# Patient Record
Sex: Male | Born: 1990 | Race: White | Hispanic: No | Marital: Single | Smoking: Never smoker
Health system: Southern US, Community
[De-identification: ages and names within clinical notes are randomized; demographics above are authoritative.]

## PROBLEM LIST (undated history)

## (undated) DIAGNOSIS — L709 Acne, unspecified: Secondary | ICD-10-CM

## (undated) DIAGNOSIS — F988 Other specified behavioral and emotional disorders with onset usually occurring in childhood and adolescence: Secondary | ICD-10-CM

## (undated) DIAGNOSIS — T7840XA Allergy, unspecified, initial encounter: Secondary | ICD-10-CM

## (undated) HISTORY — DX: Other specified behavioral and emotional disorders with onset usually occurring in childhood and adolescence: F98.8

## (undated) HISTORY — DX: Allergy, unspecified, initial encounter: T78.40XA

## (undated) HISTORY — DX: Acne, unspecified: L70.9

## (undated) HISTORY — PX: ANTERIOR INTEROSSEOUS NERVE DECOMPRESSION: SHX5735

---
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
## 2009-09-09 ENCOUNTER — Ambulatory Visit: Payer: Self-pay | Admitting: Family Medicine

## 2011-01-16 ENCOUNTER — Ambulatory Visit (INDEPENDENT_AMBULATORY_CARE_PROVIDER_SITE_OTHER): Payer: No Typology Code available for payment source | Admitting: Family Medicine

## 2011-01-16 DIAGNOSIS — J209 Acute bronchitis, unspecified: Secondary | ICD-10-CM

## 2011-01-16 DIAGNOSIS — H109 Unspecified conjunctivitis: Secondary | ICD-10-CM

## 2011-01-16 DIAGNOSIS — H6690 Otitis media, unspecified, unspecified ear: Secondary | ICD-10-CM

## 2011-09-14 ENCOUNTER — Encounter: Payer: Self-pay | Admitting: Family Medicine

## 2011-09-15 ENCOUNTER — Encounter: Payer: Self-pay | Admitting: Family Medicine

## 2011-09-15 ENCOUNTER — Ambulatory Visit (INDEPENDENT_AMBULATORY_CARE_PROVIDER_SITE_OTHER): Payer: No Typology Code available for payment source | Admitting: Family Medicine

## 2011-09-15 VITALS — BP 120/80 | HR 82 | Wt 252.0 lb

## 2011-09-15 DIAGNOSIS — F419 Anxiety disorder, unspecified: Secondary | ICD-10-CM

## 2011-09-15 DIAGNOSIS — G4700 Insomnia, unspecified: Secondary | ICD-10-CM

## 2011-09-15 DIAGNOSIS — F411 Generalized anxiety disorder: Secondary | ICD-10-CM

## 2011-09-15 NOTE — Patient Instructions (Signed)
Discussed the best way to handle this situation in regards to treating this gentleman with indifference and also make sure that he is never alone especially when coming and going from private places. Try to document everything as much is possible if there is any confrontation.
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
## 2014-07-03 ENCOUNTER — Ambulatory Visit (INDEPENDENT_AMBULATORY_CARE_PROVIDER_SITE_OTHER): Payer: BC Managed Care – PPO | Admitting: Medical

## 2014-07-03 ENCOUNTER — Encounter: Payer: Self-pay | Admitting: Medical

## 2014-07-03 VITALS — BP 112/80 | HR 92 | Temp 98.3°F | Resp 16 | Wt 261.0 lb

## 2014-07-03 DIAGNOSIS — F341 Dysthymic disorder: Secondary | ICD-10-CM

## 2014-07-03 DIAGNOSIS — F411 Generalized anxiety disorder: Secondary | ICD-10-CM

## 2014-07-03 MED ORDER — CITALOPRAM HYDROBROMIDE 20 MG PO TABS: ORAL_TABLET | ORAL | Status: DC

## 2014-07-03 NOTE — Progress Notes (Signed)
  Subjective:     Keith Olsen is a 23 y.o. male who presents for c/o "depression."   He reports a depressed mood for 3-4 years now. Past when he discussed this with his parents they recommended against medication but lately they feel like he needs to do something else to help his depressed mood. He feeling down often, worries a lot, has nightmares at times, doesn't sleep well, not really happy most of the time, does have some mood swings. Denies suicidal or homicidal ideation.  Lives with mother, but gets along with both parents and his sister. In general hobbies include outdoors things, fishing.   Works at [employer] full time, works varying shifts which is a problem for him.  He feels lonely and down most of the time.  Not exercising, diet could be better.  Has been on Zoloft in the remote past.  He does note a major incident in his life in college that has continued to be a problem for him emotionally.  Went to counseling last year for several months.   No other aggravating or relieving factors. No other c/o.  The following portions of the patient's history were reviewed and updated as appropriate: allergies, current medications, past family history, past medical history, past social history, past surgical history and problem list.  Review of Systems General: No recent weight change, no anorexia Neuro: +sleep changes Psychology: Denies suicidal ideation, hallucinations, alcohol use, drug use  Objective:    BP 112/80  Pulse 92  Temp(Src) 98.3 F (36.8 C) (Oral)  Resp 16  Wt 261 lb (118.389 kg)   General appearance: alert, no distress, WD/WN, male Psychiatric: normal affect, behavior normal, pleasant, normal hygiene and grooming.        Assessment:   Encounter Diagnosis  Name Primary?  . Dysthymic disorder Yes     Plan:   Discussed symptoms and concerns.  PHQ-9 with score of 14.  Discussed his symptoms more consistent with Dysthymia and anxiety.  Discussed several  medication options, risk of medications /benefits of medications.   Begin Medications: Citalopram.  Consider counseling.   Discussed eating healthy diet, exercising regularly, getting 6-8 hours of sleep, getting a more consistent schedule, discussed nurturing friendships, having a group of supportive friends.  Recheck in 3-4.  Return or call sooner if worse or no improvement.  Greater than 50% of time/>30 min spent face-to-face discussing concerns, evaluation, treatment plan, and followup.

## 2014-10-08 ENCOUNTER — Ambulatory Visit (INDEPENDENT_AMBULATORY_CARE_PROVIDER_SITE_OTHER): Payer: BC Managed Care – PPO | Admitting: Family Medicine

## 2014-10-08 ENCOUNTER — Encounter: Payer: Self-pay | Admitting: Family Medicine

## 2014-10-08 VITALS — BP 130/90 | HR 79 | Wt 262.0 lb

## 2014-10-08 DIAGNOSIS — M79671 Pain in right foot: Secondary | ICD-10-CM

## 2014-10-08 NOTE — Patient Instructions (Signed)
800 mg of ibuprofen 3 times per day and arch supports. Do this for the next 4 or 5 days and let me know.

## 2014-10-08 NOTE — Progress Notes (Signed)
   Subjective:    Patient ID: Keith Olsen, male    DOB: August 20, 1991, 23 y.o.   MRN: [id number]  HPI 3 days ago he woke up with right foot pain. No history of previous recent injury or he feels a tight sensation in his ankle worse with walking. No new physical activities or shoes within the last couple of weeks.   Review of Systems     Objective:   Physical Exam Exam of the right foot shows full motion of the foot and ankle passively however active range of motion was slightly uncomfortable. Some tenderness spell patient over the tarsometatarsal area especially the midportion. No laxity noted. Normal strength.       Assessment & Plan:  Right foot pain  I will treat with ibuprofen and recommend arch supports. Discussed follow-up in regard to x-rays or possible further intervention at a later date.

## 2014-12-27 ENCOUNTER — Encounter: Payer: Self-pay | Admitting: Medical

## 2014-12-27 ENCOUNTER — Ambulatory Visit (INDEPENDENT_AMBULATORY_CARE_PROVIDER_SITE_OTHER): Payer: BLUE CROSS/BLUE SHIELD | Admitting: Medical

## 2014-12-27 VITALS — BP 140/90 | HR 72 | Temp 98.0°F | Resp 16 | Wt 270.0 lb

## 2014-12-27 DIAGNOSIS — R030 Elevated blood-pressure reading, without diagnosis of hypertension: Secondary | ICD-10-CM

## 2014-12-27 DIAGNOSIS — R82998 Other abnormal findings in urine: Secondary | ICD-10-CM

## 2014-12-27 DIAGNOSIS — R8299 Other abnormal findings in urine: Secondary | ICD-10-CM

## 2014-12-27 DIAGNOSIS — R3129 Other microscopic hematuria: Secondary | ICD-10-CM

## 2014-12-27 DIAGNOSIS — M791 Myalgia, unspecified site: Secondary | ICD-10-CM

## 2014-12-27 DIAGNOSIS — R312 Other microscopic hematuria: Secondary | ICD-10-CM

## 2014-12-27 DIAGNOSIS — R5383 Other fatigue: Secondary | ICD-10-CM

## 2014-12-27 LAB — CBC WITH DIFFERENTIAL/PLATELET
Basophils Absolute: 0 10*3/uL (ref 0.0–0.1)
Basophils Relative: 0 % (ref 0–1)
Eosinophils Absolute: 0.5 10*3/uL (ref 0.0–0.7)
Eosinophils Relative: 5 % (ref 0–5)
HCT: 48.3 % (ref 39.0–52.0)
Hemoglobin: 17.1 g/dL — ABNORMAL HIGH (ref 13.0–17.0)
Lymphocytes Relative: 30 % (ref 12–46)
Lymphs Abs: 2.7 10*3/uL (ref 0.7–4.0)
MCH: 32.4 pg (ref 26.0–34.0)
MCHC: 35.4 g/dL (ref 30.0–36.0)
MCV: 91.7 fL (ref 78.0–100.0)
MPV: 10 fL (ref 8.6–12.4)
Monocytes Absolute: 0.7 10*3/uL (ref 0.1–1.0)
Monocytes Relative: 8 % (ref 3–12)
Neutro Abs: 5.1 10*3/uL (ref 1.7–7.7)
Neutrophils Relative %: 57 % (ref 43–77)
Platelets: 309 10*3/uL (ref 150–400)
RBC: 5.27 MIL/uL (ref 4.22–5.81)
RDW: 13.3 % (ref 11.5–15.5)
WBC: 9 10*3/uL (ref 4.0–10.5)

## 2014-12-27 LAB — POCT URINALYSIS DIPSTICK
Bilirubin, UA: NEGATIVE
Glucose, UA: NEGATIVE
Ketones, UA: NEGATIVE
Leukocytes, UA: NEGATIVE
Nitrite, UA: NEGATIVE
Spec Grav, UA: 1.02
Urobilinogen, UA: NEGATIVE
pH, UA: 6

## 2014-12-27 LAB — HEPATIC FUNCTION PANEL
ALT: 555 U/L — ABNORMAL HIGH (ref 0–53)
AST: 1378 U/L — ABNORMAL HIGH (ref 0–37)
Albumin: 4.6 g/dL (ref 3.5–5.2)
Alkaline Phosphatase: 64 U/L (ref 39–117)
Bilirubin, Direct: 0.2 mg/dL (ref 0.0–0.3)
Indirect Bilirubin: 0.7 mg/dL (ref 0.2–1.2)
Total Bilirubin: 0.9 mg/dL (ref 0.2–1.2)
Total Protein: 7.4 g/dL (ref 6.0–8.3)

## 2014-12-27 LAB — BASIC METABOLIC PANEL
BUN: 10 mg/dL (ref 6–23)
CO2: 27 mEq/L (ref 19–32)
Calcium: 9.8 mg/dL (ref 8.4–10.5)
Chloride: 102 mEq/L (ref 96–112)
Creat: 0.73 mg/dL (ref 0.50–1.35)
Glucose, Bld: 83 mg/dL (ref 70–99)
Potassium: 5.1 mEq/L (ref 3.5–5.3)
Sodium: 138 mEq/L (ref 135–145)

## 2014-12-27 LAB — CK: Total CK: >50000 U/L (ref 7–232)

## 2014-12-27 NOTE — Addendum Note (Signed)
Addended by: Lilli LightLOMAX, Keyston Ardolino G on: 12/27/2014 03:10 PM   Modules accepted: Orders

## 2014-12-27 NOTE — Progress Notes (Signed)
Subjective: Here for urinary concerns.  Started working out recently.  Working with an Event organiserathletic trainer.  Just start doing some routine exercise including weight lifting 2 days ago.  Is very sore today particular upper arm muscles.  Yesterday urine was brown, this morning had some discomfort with urination.  This scared him.  Sore all over. Hasn't seen urine like this prior.   Water intake in general has been good of late.   Denies fever, no blood in urine, urinating less than usual.  Some recent urinary urgency.  No prior UTI.   No concern for STD.   Last sexual activity 2 years ago.  No stool problems, last BM today before he came here.  History of urinary tract infection, no history of rhabdomyolysis. No history of hospitalization. No history of dehydration. Denies any drug use. No other aggravating or relieving factors. No other complaint.  Past Medical History  Diagnosis Date  . Allergy     RHINITIS  . ADD (attention deficit disorder)   . Acne    Review of Systems Constitutional: -fever, -chills, -sweats, -unexpected weight change,-fatigue ENT: -runny nose, -ear pain, -sore throat Cardiology:  -chest pain, -palpitations, -edema Respiratory: -cough, -shortness of breath, -wheezing Gastroenterology: -abdominal pain, -nausea, -vomiting, -diarrhea, -constipation  Hematology: -bleeding or bruising problems Musculoskeletal: -arthralgias, +myalgias, -joint swelling, -back pain Ophthalmology: -vision changes Neurology: +feels somewhat lethargic, -headache, -weakness, -tingling, -numbness    Objective: BP 140/90 mmHg  Pulse 72  Temp(Src) 98 F (36.7 C) (Oral)  Resp 16  Wt 270 lb (122.471 kg)  SpO2 97%  General appearance: alert, no distress, WD/WN, white male Skin is somewhat clammy and cool No jaundice or icterus Oral cavity: MMM, no lesions Neck: supple, no lymphadenopathy, no thyromegaly, no masses Heart: RRR, normal S1, S2, no murmurs Lungs: CTA bilaterally, no wheezes, rhonchi,  or rales Abdomen: +bs, soft, non tender, non distended, no masses, no hepatomegaly, no splenomegaly Back: nontender Tender over upper arms bilat Pulses: 2+ symmetric, upper and lower extremities, normal cap refill Ext: no edema   Assessment: Encounter Diagnoses  Name Primary?  Dot Been. Dark urine Yes  . Other fatigue   . Myalgia   . Microscopic hematuria   . Elevated blood pressure reading without diagnosis of hypertension      Plan: Discussed symptoms, possible causes, urine findings.  given his recent weight lifting and strenuous activity, the urine findings likely due to activity and insufficient hydration of water.  However, we did discuss other possibilities including rhabdo, renal failure, other, etc.   STAT labs today, advised no strenuous activity, rest, significantly push water intake, no pain medication until we call back with labs.   Urine micro with no worrisome findings.   F/u pending labs.

## 2014-12-28 ENCOUNTER — Encounter (HOSPITAL_COMMUNITY): Payer: Self-pay

## 2014-12-28 ENCOUNTER — Ambulatory Visit (INDEPENDENT_AMBULATORY_CARE_PROVIDER_SITE_OTHER): Payer: BLUE CROSS/BLUE SHIELD | Admitting: Medical

## 2014-12-28 ENCOUNTER — Inpatient Hospital Stay (HOSPITAL_COMMUNITY)
Admission: EM | Admit: 2014-12-28 | Discharge: 2014-12-31 | DRG: 558 | Disposition: A | Discharge status: Home / Self Care | Payer: BLUE CROSS/BLUE SHIELD | Attending: Internal Medicine | Admitting: Internal Medicine

## 2014-12-28 DIAGNOSIS — R319 Hematuria, unspecified: Secondary | ICD-10-CM | POA: Diagnosis present

## 2014-12-28 DIAGNOSIS — R7989 Other specified abnormal findings of blood chemistry: Secondary | ICD-10-CM | POA: Diagnosis present

## 2014-12-28 DIAGNOSIS — M6282 Rhabdomyolysis: Secondary | ICD-10-CM | POA: Diagnosis not present

## 2014-12-28 DIAGNOSIS — R740 Nonspecific elevation of levels of transaminase and lactic acid dehydrogenase [LDH]: Secondary | ICD-10-CM

## 2014-12-28 DIAGNOSIS — R945 Abnormal results of liver function studies: Secondary | ICD-10-CM

## 2014-12-28 DIAGNOSIS — Z8249 Family history of ischemic heart disease and other diseases of the circulatory system: Secondary | ICD-10-CM

## 2014-12-28 DIAGNOSIS — Z809 Family history of malignant neoplasm, unspecified: Secondary | ICD-10-CM

## 2014-12-28 DIAGNOSIS — R748 Abnormal levels of other serum enzymes: Secondary | ICD-10-CM

## 2014-12-28 DIAGNOSIS — R7401 Elevation of levels of liver transaminase levels: Secondary | ICD-10-CM

## 2014-12-28 DIAGNOSIS — M791 Myalgia, unspecified site: Secondary | ICD-10-CM

## 2014-12-28 DIAGNOSIS — L709 Acne, unspecified: Secondary | ICD-10-CM | POA: Diagnosis present

## 2014-12-28 DIAGNOSIS — K760 Fatty (change of) liver, not elsewhere classified: Secondary | ICD-10-CM | POA: Diagnosis present

## 2014-12-28 LAB — CBC
HCT: 49 % (ref 39.0–52.0)
Hemoglobin: 17 g/dL (ref 13.0–17.0)
MCH: 32.3 pg (ref 26.0–34.0)
MCHC: 34.7 g/dL (ref 30.0–36.0)
MCV: 93 fL (ref 78.0–100.0)
MPV: 9.8 fL (ref 8.6–12.4)
Platelets: 292 10*3/uL (ref 150–400)
RBC: 5.27 MIL/uL (ref 4.22–5.81)
RDW: 13.1 % (ref 11.5–15.5)
WBC: 7.7 10*3/uL (ref 4.0–10.5)

## 2014-12-28 LAB — HEPATIC FUNCTION PANEL
ALT: 650 U/L — ABNORMAL HIGH (ref 0–53)
AST: 1360 U/L — ABNORMAL HIGH (ref 0–37)
Albumin: 4.4 g/dL (ref 3.5–5.2)
Alkaline Phosphatase: 70 U/L (ref 39–117)
Bilirubin, Direct: 0.2 mg/dL (ref 0.0–0.3)
Indirect Bilirubin: 0.6 mg/dL (ref 0.2–1.2)
Total Bilirubin: 0.8 mg/dL (ref 0.2–1.2)
Total Protein: 7.2 g/dL (ref 6.0–8.3)

## 2014-12-28 LAB — CBC WITH DIFFERENTIAL/PLATELET
Basophils Absolute: 0 10*3/uL (ref 0.0–0.1)
Basophils Relative: 0 % (ref 0–1)
Eosinophils Absolute: 0.5 10*3/uL (ref 0.0–0.7)
Eosinophils Relative: 4 % (ref 0–5)
HEMATOCRIT: 47.7 % (ref 39.0–52.0)
HEMOGLOBIN: 17.4 g/dL — AB (ref 13.0–17.0)
Lymphocytes Relative: 30 % (ref 12–46)
Lymphs Abs: 3.6 10*3/uL (ref 0.7–4.0)
MCH: 32.8 pg (ref 26.0–34.0)
MCHC: 36.5 g/dL — AB (ref 30.0–36.0)
MCV: 90 fL (ref 78.0–100.0)
MONOS PCT: 6 % (ref 3–12)
Monocytes Absolute: 0.7 10*3/uL (ref 0.1–1.0)
NEUTROS ABS: 7.3 10*3/uL (ref 1.7–7.7)
Neutrophils Relative %: 60 % (ref 43–77)
Platelets: 309 10*3/uL (ref 150–400)
RBC: 5.3 MIL/uL (ref 4.22–5.81)
RDW: 12.4 % (ref 11.5–15.5)
WBC: 12.1 10*3/uL — AB (ref 4.0–10.5)

## 2014-12-28 LAB — POCT URINALYSIS DIPSTICK
Bilirubin, UA: NEGATIVE
Glucose, UA: NEGATIVE
Ketones, UA: NEGATIVE
LEUKOCYTES UA: NEGATIVE
NITRITE UA: NEGATIVE
PROTEIN UA: 0.15
Spec Grav, UA: 1.025
UROBILINOGEN UA: NEGATIVE
pH, UA: 6

## 2014-12-28 LAB — BASIC METABOLIC PANEL
BUN: 7 mg/dL (ref 6–23)
CO2: 29 mEq/L (ref 19–32)
Calcium: 9.7 mg/dL (ref 8.4–10.5)
Chloride: 98 mEq/L (ref 96–112)
Creat: 0.74 mg/dL (ref 0.50–1.35)
Glucose, Bld: 93 mg/dL (ref 70–99)
Potassium: 4.9 mEq/L (ref 3.5–5.3)
Sodium: 136 mEq/L (ref 135–145)

## 2014-12-28 LAB — IRON AND TIBC
%SAT: 27 % (ref 20–55)
IRON: 74 ug/dL (ref 42–165)
TIBC: 270 ug/dL (ref 215–435)
UIBC: 196 ug/dL (ref 125–400)

## 2014-12-28 LAB — HEPATITIS PANEL, ACUTE
HCV Ab: NEGATIVE
HEP A IGM: NONREACTIVE
HEP B S AG: NEGATIVE
Hep B C IgM: NONREACTIVE

## 2014-12-28 LAB — CK: Total CK: >50000 U/L (ref 7–232)

## 2014-12-28 LAB — LACTATE DEHYDROGENASE: LDH: 1990 U/L — ABNORMAL HIGH (ref 94–250)

## 2014-12-28 MED ORDER — SODIUM CHLORIDE 0.9 % IV BOLUS (SEPSIS)
2000.0000 mL | Freq: Once | INTRAVENOUS | Status: AC
Start: 2014-12-28 — End: 2014-12-29
  Administered 2014-12-29: 2000 mL via INTRAVENOUS

## 2014-12-28 MED ORDER — SODIUM CHLORIDE 0.9 % IV BOLUS (SEPSIS)
1000.0000 mL | INTRAVENOUS | Status: AC
Start: 2014-12-28 — End: 2014-12-29
  Administered 2014-12-28: 1000 mL via INTRAVENOUS

## 2014-12-28 NOTE — ED Notes (Signed)
Per pt, has had increase in working out and then drinking alcohol afterwards.  Pt noticed brown urine on Wednesday. Primary did labs.  Pt elevated liver enzymes.  Told to come to ED

## 2014-12-28 NOTE — H&P (Signed)
PCP: Carollee Herter, MD    Chief Complaint:    HPI: Keith Olsen is a 24 y.o. male   has a past medical history of Allergy; ADD (attention deficit disorder); and Acne.   Presented with  5 days ago patient went to gym and lifted weights, he was together with a friend who was encouraging him. Patient develop severe pain in his upper arms and some swelling. He had couple of beers there after. 2 days ago patient reported brown urine. Patient presented to his PCP and got some blood work done. He was intsructed to drink plenty of water. Currently patient states that his urine is clear. From outside labs it was noted that his CK was 50K and severe LFT elevation. Patient took 4 tabs of ibuprofen twice. Did not take any acetaminophen. Patient endorses improvement currently with decreased pain.   Hospitalist was called for admission for significant rhabdomyalisis  Review of Systems:    Pertinent positives include: arm pain and swelling, dark urine  Constitutional:  No weight loss, night sweats, Fevers, chills, fatigue, weight loss  HEENT:  No headaches, Difficulty swallowing,Tooth/dental problems,Sore throat,  No sneezing, itching, ear ache, nasal congestion, post nasal drip,  Cardio-vascular:  No chest pain, Orthopnea, PND, anasarca, dizziness, palpitations.no Bilateral lower extremity swelling  GI:  No heartburn, indigestion, abdominal pain, nausea, vomiting, diarrhea, change in bowel habits, loss of appetite, melena, blood in stool, hematemesis Resp:  no shortness of breath at rest. No dyspnea on exertion, No excess mucus, no productive cough, No non-productive cough, No coughing up of blood.No change in color of mucus.No wheezing. Skin:  no rash or lesions. No jaundice GU:  no dysuria, change in color of urine, no urgency or frequency. No straining to urinate.  No flank pain.  Musculoskeletal:  No joint pain or no joint swelling. No decreased range of motion. No back pain.    Psych:  No change in mood or affect. No depression or anxiety. No memory loss.  Neuro: no localizing neurological complaints, no tingling, no weakness, no double vision, no gait abnormality, no slurred speech, no confusion  Otherwise ROS are negative except for above, 10 systems were reviewed  Past Medical History: Past Medical History  Diagnosis Date  . Allergy     RHINITIS  . ADD (attention deficit disorder)   . Acne    History reviewed. No pertinent past surgical history.   Medications: Prior to Admission medications   Not on File    Allergies:  No Known Allergies  Social History:  Ambulatory   independently  Lives at home  With family     reports that he has never smoked. He has never used smokeless tobacco. He reports that he drinks alcohol. He reports that he does not use illicit drugs.    Family History: family history includes Cancer in his maternal grandmother; Glaucoma in his father and paternal grandmother; Heart disease in his maternal grandfather; Hypertension in his maternal grandfather.    Physical Exam: Patient Vitals for the past 24 hrs:  BP Temp Temp src Pulse Resp SpO2  12/28/14 2235 134/82 mmHg - - 101 18 96 %  12/28/14 1805 161/87 mmHg 97.6 F (36.4 C) Oral 108 20 100 %    1. General:  in No Acute distress 2. Psychological: Alert and  Oriented 3. Head/ENT:   Moist   Mucous Membranes  Head Non traumatic, neck supple                          Normal   Dentition 4. SKIN: normal  Skin turgor,  Skin clean Dry and intact no rash 5. Heart: Regular rate and rhythm no Murmur, Rub or gallop 6. Lungs: Clear to auscultation bilaterally, no wheezes or crackles   7. Abdomen: Soft, non-tender, Non distended 8. Lower extremities: no clubbing, cyanosis, or edema 9. Neurologically Grossly intact, moving all 4 extremities equally 10. MSK: Normal range of motion, some swelling of biceps bilaterally noted,  no symptoms consistent with  compartment syndrome  body mass index is unknown because there is no height or weight on file.   Labs on Admission:   Results for orders placed or performed during the hospital encounter of 12/28/14 (from the past 24 hour(s))  CBC with Differential     Status: Abnormal   Collection Time: 12/28/14 10:59 PM  Result Value Ref Range   WBC 12.1 (H) 4.0 - 10.5 K/uL   RBC 5.30 4.22 - 5.81 MIL/uL   Hemoglobin 17.4 (H) 13.0 - 17.0 g/dL   HCT 29.547.7 62.139.0 - 30.852.0 %   MCV 90.0 78.0 - 100.0 fL   MCH 32.8 26.0 - 34.0 pg   MCHC 36.5 (H) 30.0 - 36.0 g/dL   RDW 65.712.4 84.611.5 - 96.215.5 %   Platelets 309 150 - 400 K/uL   Neutrophils Relative % 60 43 - 77 %   Neutro Abs 7.3 1.7 - 7.7 K/uL   Lymphocytes Relative 30 12 - 46 %   Lymphs Abs 3.6 0.7 - 4.0 K/uL   Monocytes Relative 6 3 - 12 %   Monocytes Absolute 0.7 0.1 - 1.0 K/uL   Eosinophils Relative 4 0 - 5 %   Eosinophils Absolute 0.5 0.0 - 0.7 K/uL   Basophils Relative 0 0 - 1 %   Basophils Absolute 0.0 0.0 - 0.1 K/uL    UA small hemoglobin  No results found for: HGBA1C  CrCl cannot be calculated (Unknown ideal weight.).  BNP (last 3 results) No results for input(s): PROBNP in the last 8760 hours.  Other results:  I have pearsonaly reviewed this: ECG not obtained  There were no vitals filed for this visit.   Cultures: No results found for: SDES, SPECREQUEST, CULT, REPTSTATUS   Radiological Exams on Admission: No results found.  Chart has been reviewed  Assessment/Plan  24yo Male here with his nontraumatic rhabdomyolysis after excessive exercise with normal creatinine and evidence of elevated LFTs likely secondary to muscular breakdown  Present on Admission:  . Rhabdomyolysis - give IV fluids continue to morning to serial CK, patient is already 5 days out with self reported improvement in the urine collar as well as tenderness to his upper extremities  . Elevated LFTs - most likely secondary to muscular breakdown we'll continue to  follow. Report acute hepatitis panel is negative   Prophylaxis: SCD   CODE STATUS:  FULL CODE   Other plan as per orders.  I have spent a total of 55 min on this admission  Keith Olsen 12/28/2014, 11:58 PM  [facility]  Pager [phone]   after 2 AM please page floor coverage PA If 7AM-7PM, please contact the day team taking care of the patient  [email or web address]  Password [email or web address]

## 2014-12-28 NOTE — Addendum Note (Signed)
Addended by: Herminio CommonsJOHNSON, Freja Faro A on: 12/28/2014 04:21 PM   Modules accepted: Orders

## 2014-12-28 NOTE — ED Provider Notes (Signed)
CSN: [id number]     Arrival date & time 12/28/14  1801 History   First MD Initiated Contact with Patient 12/28/14 2233     Chief Complaint  Patient presents with  . Hematuria     (Consider location/radiation/quality/duration/timing/severity/associated sxs/prior Treatment) HPI   This is a 24 year old male presents emergency department for chief complaint of dark colored urine. The patient was seen at his primary care office 2 days ago. 5 days ago the patient began working out. He does not normally workout. He stated he lifted heavy. He had severe pain in his arms and noticed that his urine turned dark brown. His physician told him that his liver enzymes were elevated and that he needed to go to the emergency department. She did not come yesterday because of two-hour weights. His physician told him to drink plenty of fluids and return in the morning. He states his urine is clear.  Denies fevers, chills, myalgias, arthralgias. Denies DOE, SOB, chest tightness or pressure, radiation to left arm, jaw or back, or diaphoresis. Denies dysuria, flank pain, suprapubic pain, frequency, urgency, or hematuria. Denies headaches, light headedness, weakness, visual disturbances. Denies abdominal pain, nausea, vomiting, diarrhea or constipation.   Past Medical History  Diagnosis Date  . Allergy     RHINITIS  . ADD (attention deficit disorder)   . Acne    History reviewed. No pertinent past surgical history. Family History  Problem Relation Age of Onset  . Glaucoma Father   . Cancer Maternal Grandmother   . Hypertension Maternal Grandfather   . Heart disease Maternal Grandfather   . Glaucoma Paternal Grandmother    History  Substance Use Topics  . Smoking status: Never Smoker   . Smokeless tobacco: Never Used  . Alcohol Use: Yes     Comment: social    Review of Systems  Ten systems reviewed and are negative for acute change, except as noted in the HPI.    Allergies  Review of patient's  allergies indicates no known allergies.  Home Medications   Prior to Admission medications   Not on File   BP 134/82 mmHg  Pulse 101  Temp(Src) 97.6 F (36.4 C) (Oral)  Resp 18  SpO2 96% Physical Exam  Constitutional: He appears well-developed and well-nourished. No distress.  HENT:  Head: Normocephalic and atraumatic.  Eyes: Conjunctivae are normal. No scleral icterus.  Neck: Normal range of motion. Neck supple.  Cardiovascular: Normal rate, regular rhythm and normal heart sounds.   Pulmonary/Chest: Effort normal and breath sounds normal. No respiratory distress.  Abdominal: Soft. There is no tenderness.  Musculoskeletal: He exhibits no edema.  Neurological: He is alert.  Skin: Skin is warm and dry. He is not diaphoretic.  Psychiatric: His behavior is normal.  Nursing note and vitals reviewed.   ED Course  Procedures (including critical care time) Labs Review Labs Reviewed  COMPREHENSIVE METABOLIC PANEL  CBC WITH DIFFERENTIAL  URINALYSIS, ROUTINE W REFLEX MICROSCOPIC    Imaging Review No results found.   EKG Interpretation None       Results for orders placed or performed in visit on 12/28/14  Hepatic Function Panel  Result Value Ref Range   Total Bilirubin 0.8 0.2 - 1.2 mg/dL   Bilirubin, Direct 0.2 0.0 - 0.3 mg/dL   Indirect Bilirubin 0.6 0.2 - 1.2 mg/dL   Alkaline Phosphatase 70 39 - 117 U/L   AST 1360 (H) 0 - 37 U/L   ALT 650 (H) 0 - 53 U/L   Total  Protein 7.2 6.0 - 8.3 g/dL   Albumin 4.4 3.5 - 5.2 g/dL  Basic metabolic panel  Result Value Ref Range   Sodium 136 135 - 145 mEq/L   Potassium 4.9 3.5 - 5.3 mEq/L   Chloride 98 96 - 112 mEq/L   CO2 29 19 - 32 mEq/L   Glucose, Bld 93 70 - 99 mg/dL   BUN 7 6 - 23 mg/dL   Creat 1.61 0.96 - 0.45 mg/dL   Calcium 9.7 8.4 - 40.9 mg/dL  Lactate dehydrogenase  Result Value Ref Range   LDH 1990 (H) 94 - 250 U/L  Hepatitis panel, acute  Result Value Ref Range   Hepatitis B Surface Ag NEGATIVE NEGATIVE    HCV Ab NEGATIVE NEGATIVE   Hep B C IgM NON REACTIVE NON REACTIVE   Hep A IgM NON REACTIVE NON REACTIVE  Iron and TIBC  Result Value Ref Range   Iron 74 42 - 165 ug/dL   UIBC 811 914 - 782 ug/dL   TIBC 956 213 - 086 ug/dL   %SAT 27 20 - 55 %  CBC  Result Value Ref Range   WBC 7.7 4.0 - 10.5 K/uL   RBC 5.27 4.22 - 5.81 MIL/uL   Hemoglobin 17.0 13.0 - 17.0 g/dL   HCT 57.8 46.9 - 62.9 %   MCV 93.0 78.0 - 100.0 fL   MCH 32.3 26.0 - 34.0 pg   MCHC 34.7 30.0 - 36.0 g/dL   RDW 52.8 41.3 - 24.4 %   Platelets 292 150 - 400 K/uL   MPV 9.8 8.6 - 12.4 fL  CK  Result Value Ref Range   Total CK >50000 (HH) 7 - 232 U/L  POCT urinalysis dipstick  Result Value Ref Range   Color, UA yellow orange    Clarity, UA clear    Glucose, UA n    Bilirubin, UA n    Ketones, UA n    Spec Grav, UA 1.025    Blood, UA 3+    pH, UA 6.0    Protein, UA 0.15    Urobilinogen, UA negative    Nitrite, UA n    Leukocytes, UA Negative      MDM   Final diagnoses:  Non-traumatic rhabdomyolysis  Elevated liver enzymes   Patient here with Rhabdomyolysis and Marked transaminitis. 2 L fluid bolus begun. His creatinine drawn at pcp appears to be wnl. I have ordered repeat labs. Patient will be admitted by Dr.Doutova. Pt stable in ED with no significant deterioration in condition.  I personally reviewed the imaging tests through PACS system. I have reviewed and interpreted Lab values. I reviewed available ER/hospitalization records through the EMR       Arthor Captain, PA-C 12/31/14 1600  Purvis Sheffield, MD 01/01/15 [phone]

## 2014-12-28 NOTE — Progress Notes (Signed)
Subjective: Here for recheck at my request.  accompanied by father today.  He came in yesterday for muscle soreness, fatigue.   STAT labs yesterday revealed elevated LFTs, CK elevation, dark urine.   We were going to have him go to the ED last night but given the long wait, he pushed hydration and rested all night.  Last night and this morning urine more clear, certainly not brown.   Muscles still sore, but not as bad as yesterday.  In the last fwe days had taken 400 mg Ibuprofen twice daily.   Hasn't taken any tylenol of recent.   Last alcohol was Monday 5 days ago, 2-3 beers. Prior to this had drank 5-6 beers about 5 days prior to this.   Drinks alcohol once every few weeks.  Hasn't been taking any weight lifting supplements.   Last strenuous exercise was 5 days ago Monday.   That day did bench press, dumbbells, all arm weights.  Ran for 10 minutes.  Worked out about 2 hours that day.  Was drinking some water during the workout.  He hadn't been working out regularly until this Monday where he did the weight lifting, and later than evening was when he drank some alcohol.  In general, drinks water bottles throughout the day at work.   Works at [employer].  No family hx/o hemochromatosis.  No recent foreign travel.  No exposure to hepatitis.  Not sexually active.  No history of rhabdomyolysis. No history of hospitalization. No history of dehydration. Denies any drug use. No other aggravating or relieving factors. No other complaint.  Past Medical History  Diagnosis Date  . Allergy     RHINITIS  . ADD (attention deficit disorder)   . Acne    No past surgical history on file.  Review of Systems Constitutional: -fever, -chills, -sweats, -unexpected weight change,-fatigue ENT: -runny nose, -ear pain, -sore throat Cardiology:  -chest pain, -palpitations, -edema Respiratory: -cough, -shortness of breath, -wheezing Gastroenterology: -abdominal pain, -nausea, -vomiting, -diarrhea, -constipation   Hematology: -bleeding or bruising problems Musculoskeletal: -arthralgias, +myalgias, -joint swelling, -back pain Ophthalmology: -vision changes Neurology: +feels somewhat lethargic, -headache, -weakness, -tingling, -numbness    Objective: General appearance: alert, no distress, WD/WN, white male Skin is somewhat clammy and cool No jaundice or icterus Oral cavity: MMM, no lesions Neck: supple, no lymphadenopathy, no thyromegaly, no masses Heart: RRR, normal S1, S2, no murmurs Lungs: CTA bilaterally, no wheezes, rhonchi, or rales Abdomen: +bs, soft, non tender, non distended, no masses, no hepatomegaly, no splenomegaly Back: nontender Tender over upper arms bilat Pulses: 2+ symmetric, upper and lower extremities, normal cap refill Ext: no edema   Assessment: Encounter Diagnoses  Name Primary?  . Elevated CK Yes  . Elevated LFTs   . Myalgia      Plan: Discussed symptoms, lab findings.  Discussed with supervising physicians Dr. Susann Givens and Dr. Lynelle Doctor last night and again wth Dr. Susann Givens this morning . At this point he likely had elevated CK and LFTs from recent overexertion combined with dehydration and alcohol intake.   He has done a great job hydrating and resting in the last 12 hours.   STAT labs today.   Advised continued rest through the weekend, avoid alcohol intake, continue to push good hydration as discussed and can include Gatorade and healthy fruits and carbs mixed in.   We will call with lab results.   Will recheck CK in a month as well.  Pending labs may need other eval too.  Improved clinically.  F/u pending labs.  Discussed with Earl LitesGregory and his father and they voice understanding.

## 2014-12-28 NOTE — Patient Instructions (Addendum)
Rhabdomyolysis Rhabdomyolysis is the breakdown of muscle fibers due to injury. The injury may come from physical damage to the muscle like an injury but other causes are:  High fever (hyperthermia).  Seizures (convulsions).  Low phosphate levels.  Diseases of metabolism.  Heatstroke.  Drug toxicity.  Over exertion.  Alcoholism.  Muscle is cut off from oxygen (anoxia).  The squeezing of nerves and blood vessels (compartment syndrome). Some drugs which may cause the breakdown of muscle are:  Antibiotics.  Statins.  Alcohol.  Animal toxins. Myoglobin is a substance which helps muscle use oxygen. When the muscle is damaged, the myoglobin is released into the bloodstream. It is filtered out of the bloodstream by the kidneys. Myoglobin may block up the kidneys. This may cause damage, such as kidney failure. It also breaks down into other damaging toxic parts, which also cause kidney failure.  SYMPTOMS   Dark, red, or tea colored urine.  Weakness of affected muscles.  Weight gain from water retention.  Joint aches and pains.  Irregular heart from high potassium in the blood.  Muscle tenderness or aching.  Generalized weakness.  Seizures.  Feeling tired (fatigue). DIAGNOSIS  Your caregiver may find muscle tenderness on exam and suspect the problem. Urine tests and blood work can confirm the problem. TREATMENT   Early and aggressive treatment with large amounts of fluids may help prevent kidney failure.  Water producing medicine (diuretic) may be used to help flush the kidneys.  High potassium and calcium problems (electrolyte) in your blood may need treatment. HOME CARE INSTRUCTIONS  This problem is usually cared for in a hospital. If you are allowed to go home and require dialysis, make sure you keep all appointments for lab work and dialysis. Not doing so could result in death. Document Released: 11/12/2004 Document Revised: 02/22/2012 Document Reviewed:  05/27/2009 [facility] Patient Information 2015 [place], [place]. This information is not intended to replace advice given to you by your health care provider. Make sure you discuss any questions you have with your health care provider.   Alanine Aminotransferase Alanine aminotransferase (ALT) is an enzyme that is found mainly in the liver but also in the kidney, heart, and skeletal muscle. Under normal conditions, ALT levels in the blood are low. Injury to any of these organs can produce elevations. Damage to the liver is the main cause of an abnormality of this enzyme, but it can be caused by damage to any of the organ systems listed above. ALT is also tested in long-standing liver disease. ALT may be used to monitor the treatment of people who have liver disease or to see if the treatment is working. A large number of medicines may also cause elevations. ALT may be ordered either by itself or along with other tests. PREPARATION FOR TEST No preparation or fasting is required. A blood sample is taken by a needle from a vein.  If you are male, avoid strenuous exercise before the test.  Tell the person doing the test if you are a smoker. NORMAL FINDINGS   Adult or child: 0 to 35 units/L (0 to 0.58 microkat/L)  Elderly adult may be slightly higher  Infant: may be twice as high as an adult  Values may be higher in men and African Americans. Ranges for normal findings may vary among different laboratories and hospitals. You should always check with your caregiver after having lab work or other tests done to discuss the meaning of your test results and whether your values are considered  within normal limits. MEANING OF TEST  Your caregiver will go over the test results with you and discuss the importance and meaning of your results, as well as treatment options and the need for additional tests if necessary. OBTAINING THE TEST RESULTS  It is your responsibility to obtain your test results. Ask the  lab or department performing the test when and how you will get your results. Document Released: 12/22/2004 Document Revised: 02/22/2012 Document Reviewed: 11/04/2008 [facility] Patient Information 2015 [place], [place]. This information is not intended to replace advice given to you by your health care provider. Make sure you discuss any questions you have with your health care provider.

## 2014-12-29 DIAGNOSIS — M6282 Rhabdomyolysis: Principal | ICD-10-CM

## 2014-12-29 DIAGNOSIS — R7989 Other specified abnormal findings of blood chemistry: Secondary | ICD-10-CM

## 2014-12-29 LAB — COMPREHENSIVE METABOLIC PANEL
ALBUMIN: 3.8 g/dL (ref 3.5–5.2)
ALK PHOS: 68 U/L (ref 39–117)
ALT: 675 U/L — ABNORMAL HIGH (ref 0–53)
ALT: 581 U/L — ABNORMAL HIGH (ref 0–53)
AST: 1373 U/L — ABNORMAL HIGH (ref 0–37)
AST: 1049 U/L — ABNORMAL HIGH (ref 0–37)
Albumin: 4.7 g/dL (ref 3.5–5.2)
Alkaline Phosphatase: 55 U/L (ref 39–117)
Anion gap: 5 (ref 5–15)
Anion gap: 3 — ABNORMAL LOW (ref 5–15)
BILIRUBIN TOTAL: 1 mg/dL (ref 0.3–1.2)
BUN: 9 mg/dL (ref 6–23)
BUN: 9 mg/dL (ref 6–23)
CALCIUM: 9.2 mg/dL (ref 8.4–10.5)
CHLORIDE: 105 meq/L (ref 96–112)
CHLORIDE: 103 meq/L (ref 96–112)
CO2: 25 mmol/L (ref 19–32)
CO2: 26 mmol/L (ref 19–32)
CREATININE: 0.7 mg/dL (ref 0.50–1.35)
Calcium: 8.2 mg/dL — ABNORMAL LOW (ref 8.4–10.5)
Creatinine, Ser: 0.67 mg/dL (ref 0.50–1.35)
GFR calc Af Amer: >90 mL/min (ref >90)
GFR calc non Af Amer: >90 mL/min (ref >90)
GFR calc non Af Amer: >90 mL/min (ref >90)
GLUCOSE: 98 mg/dL (ref 70–99)
Glucose, Bld: 101 mg/dL — ABNORMAL HIGH (ref 70–99)
Potassium: 4.3 mmol/L (ref 3.5–5.1)
Potassium: 3.9 mmol/L (ref 3.5–5.1)
SODIUM: 132 mmol/L — AB (ref 135–145)
Sodium: 135 mmol/L (ref 135–145)
TOTAL PROTEIN: 7.8 g/dL (ref 6.0–8.3)
Total Bilirubin: 0.8 mg/dL (ref 0.3–1.2)
Total Protein: 6.3 g/dL (ref 6.0–8.3)

## 2014-12-29 LAB — RAPID URINE DRUG SCREEN, HOSP PERFORMED
AMPHETAMINES: NOT DETECTED
Barbiturates: NOT DETECTED
Benzodiazepines: NOT DETECTED
COCAINE: NOT DETECTED
OPIATES: NOT DETECTED
Tetrahydrocannabinol: NOT DETECTED

## 2014-12-29 LAB — URINALYSIS, ROUTINE W REFLEX MICROSCOPIC
Bilirubin Urine: NEGATIVE
Glucose, UA: NEGATIVE mg/dL
Ketones, ur: NEGATIVE mg/dL
Leukocytes, UA: NEGATIVE
Nitrite: NEGATIVE
PROTEIN: NEGATIVE mg/dL
Specific Gravity, Urine: 1.021 (ref 1.005–1.030)
UROBILINOGEN UA: 1 mg/dL (ref 0.0–1.0)
pH: 5 (ref 5.0–8.0)

## 2014-12-29 LAB — CBC
HCT: 42 % (ref 39.0–52.0)
HEMOGLOBIN: 14.9 g/dL (ref 13.0–17.0)
MCH: 32.3 pg (ref 26.0–34.0)
MCHC: 35.5 g/dL (ref 30.0–36.0)
MCV: 91.1 fL (ref 78.0–100.0)
Platelets: 263 10*3/uL (ref 150–400)
RBC: 4.61 MIL/uL (ref 4.22–5.81)
RDW: 12.4 % (ref 11.5–15.5)
WBC: 10.1 10*3/uL (ref 4.0–10.5)

## 2014-12-29 LAB — URINE MICROSCOPIC-ADD ON

## 2014-12-29 LAB — TSH: TSH: 4.561 u[IU]/mL — AB (ref 0.350–4.500)

## 2014-12-29 LAB — CK
Total CK: >50000 U/L — ABNORMAL HIGH (ref 7–232)
Total CK: >50000 U/L — ABNORMAL HIGH (ref 7–232)

## 2014-12-29 LAB — PHOSPHORUS: PHOSPHORUS: 3.5 mg/dL (ref 2.3–4.6)

## 2014-12-29 LAB — MAGNESIUM: Magnesium: 2 mg/dL (ref 1.5–2.5)

## 2014-12-29 MED ORDER — ONDANSETRON HCL 4 MG/2ML IJ SOLN
4.0000 mg | Freq: Four times a day (QID) | INTRAMUSCULAR | Status: DC | PRN
  Administered 2014-12-30: 4 mg via INTRAVENOUS
  Filled 2014-12-29: qty 2

## 2014-12-29 MED ORDER — ONDANSETRON HCL 4 MG PO TABS: 4.0000 mg | ORAL_TABLET | Freq: Four times a day (QID) | ORAL | Status: DC | PRN

## 2014-12-29 MED ORDER — SODIUM CHLORIDE 0.9 % IV SOLN
INTRAVENOUS | Status: DC
  Administered 2014-12-30 – 2014-12-31 (×2): via INTRAVENOUS

## 2014-12-29 MED ORDER — SODIUM CHLORIDE 0.9 % IJ SOLN: 3.0000 mL | Freq: Two times a day (BID) | INTRAMUSCULAR | Status: DC

## 2014-12-29 MED ORDER — SODIUM CHLORIDE 0.9 % IV BOLUS (SEPSIS): 1000.0000 mL | INTRAVENOUS | Status: AC

## 2014-12-29 MED ORDER — TRAZODONE HCL 50 MG PO TABS
25.0000 mg | ORAL_TABLET | Freq: Every evening | ORAL | Status: DC | PRN
  Administered 2014-12-29: 25 mg via ORAL
  Filled 2014-12-29: qty 1

## 2014-12-29 MED ORDER — SODIUM CHLORIDE 0.9 % IV SOLN
INTRAVENOUS | Status: AC
  Administered 2014-12-29: 03:00:00 via INTRAVENOUS

## 2014-12-29 NOTE — Progress Notes (Signed)
[facility] PROGRESS NOTE  CHRISTOPHR CALIX ZOX:[id number] DOB: 03-29-1991 DOA: 12/28/2014  PCP: Carollee Herter, MD  Brief HPI: 24 year old Caucasian male presented with severe pain in his upper arms along with some swelling. He had gone to the gym and lifted weights. He was found to have elevated CK consistent with rhabdomyolysis. Subsequently admitted to the hospital.  Past medical history:  Past Medical History  Diagnosis Date  . Allergy     RHINITIS  . ADD (attention deficit disorder)   . Acne     Consultants: None  Procedures: None  Antibiotics: None  Subjective: Patient is much better this. Patient is much improved.  Objective: Vital Signs  Filed Vitals:   12/29/14 0024 12/29/14 0119 12/29/14 0553 12/29/14 1003  BP: 149/82 157/87 136/69 134/72  Pulse: 97 97 100 101  Temp:  98.2 F (36.8 C) 98.3 F (36.8 C) 98 F (36.7 C)  TempSrc:  Oral Oral Oral  Resp: Height:   (1.854 m)    Weight:  122.335 kg (269 lb 11.2 oz)    SpO2: 100% 100% 100% 99%    Intake/Output Summary (Last 24 hours) at 12/29/14 1108 Last data filed at 12/29/14 0900  Gross per 24 hour  Intake    240 ml  Output      0 ml  Net    240 ml   Filed Weights   12/29/14 0119  Weight: 122.335 kg (269 lb 11.2 oz)    General appearance: alert, cooperative, appears stated age and no distress Resp: clear to auscultation bilaterally Cardio: regular rate and rhythm, S1, S2 normal, no murmur, click, rub or gallop GI: soft, non-tender; bowel sounds normal; no masses,  no organomegaly Extremities: extremities normal, atraumatic, no cyanosis or edema Neurologic: Alert and oriented 3. No focal neurological deficits.  Lab Results:  Basic Metabolic Panel:  Recent Labs Lab 12/27/14 1454 12/28/14 0930 12/28/14 2259 12/29/14 0500  NA 138 136 135 132*  K 5.1 4.9 4.3 3.9  CL 102 98 105 103  CO2 GLUCOSE 83 93 98 101*  BUN CREATININE 0.73 0.74  0.67 0.70  CALCIUM 9.8 9.7 9.2 8.2*  MG  --   --   --  2.0  PHOS  --   --   --  3.5   Liver Function Tests:  Recent Labs Lab 12/27/14 1454 12/28/14 0930 12/28/14 2259 12/29/14 0500  AST 1378* 1360* 1373* 1049*  ALT 555* 650* 675* 581*  ALKPHOS 64 70 68 55  BILITOT 0.9 0.8 0.8 1.0  PROT 7.4 7.2 7.8 6.3  ALBUMIN 4.6 4.4 4.7 3.8   CBC:  Recent Labs Lab 12/27/14 1454 12/28/14 0930 12/28/14 2259 12/29/14 0500  WBC 9.0 7.7 12.1* 10.1  NEUTROABS 5.1  --  7.3  --   HGB 17.1* 17.0 17.4* 14.9  HCT 48.3 49.0 47.7 42.0  MCV 91.7 93.0 90.0 91.1  PLT 309 292 309 263   Cardiac Enzymes:  Recent Labs Lab 12/27/14 1454 12/28/14 1102 12/28/14 2334 12/29/14 0500  CKTOTAL >50000* >50000* >50000* >50000*    Recent Results (from the past 240 hour(s))  Urine culture     Status: None (Preliminary result)   Collection Time: 12/27/14  2:58 PM  Result Value Ref Range Status   Colony Count 15,000 COLONIES/ML  Preliminary   Preliminary Report ESCHERICHIA COLI  Preliminary      Studies/Results: No results found.  Medications:  Scheduled: . sodium chloride  1,000 mL Intravenous STAT  . sodium chloride  3 mL Intravenous Q12H   Continuous: . sodium chloride 125 mL/hr (12/29/14 1125)   ZOX:[id number]:ondansetron **OR** ondansetron (ZOFRAN) IV  Assessment/Plan:  Active Problems:   Rhabdomyolysis   Elevated LFTs    Rhabdomyolysis This is secondary to physical exertion at the gym. CK continues to remain significantly elevated. Continue IV fluids. Monitor CK levels closely. Renal function is normal. Check UDS.  Elevated LFTs Most likely secondary to muscular breakdown. Abdomen is benign without any hepatomegaly. Hepatitis panel was negative. We will get ultrasound of the hepatobiliary system. Continue to trend LFTs.  DVT Prophylaxis: SCDs    Code Status: Full code  Family Communication: Discussed with the patient and his parents.  Disposition Plan: Not ready for discharge.     LOS: 1 day   [facility] PrincetonKRISHNAN,Webster Patrone  [facility] Pager [phone] 12/29/2014, 11:08 AM  If 7PM-7AM, please contact night-coverage at [email or web address], password [email or web address]

## 2014-12-30 ENCOUNTER — Inpatient Hospital Stay (HOSPITAL_COMMUNITY): Payer: BLUE CROSS/BLUE SHIELD

## 2014-12-30 LAB — COMPREHENSIVE METABOLIC PANEL
ALT: 538 U/L — ABNORMAL HIGH (ref 0–53)
AST: 708 U/L — ABNORMAL HIGH (ref 0–37)
Albumin: 3.9 g/dL (ref 3.5–5.2)
Alkaline Phosphatase: 54 U/L (ref 39–117)
Anion gap: 11 (ref 5–15)
BILIRUBIN TOTAL: 1 mg/dL (ref 0.3–1.2)
BUN: 11 mg/dL (ref 6–23)
CALCIUM: 9.2 mg/dL (ref 8.4–10.5)
CO2: 27 mmol/L (ref 19–32)
Chloride: 102 mEq/L (ref 96–112)
Creatinine, Ser: 0.68 mg/dL (ref 0.50–1.35)
GFR calc Af Amer: >90 mL/min (ref >90)
GLUCOSE: 100 mg/dL — AB (ref 70–99)
POTASSIUM: 4.3 mmol/L (ref 3.5–5.1)
SODIUM: 140 mmol/L (ref 135–145)
Total Protein: 6.5 g/dL (ref 6.0–8.3)

## 2014-12-30 LAB — CBC
HEMATOCRIT: 42.9 % (ref 39.0–52.0)
HEMOGLOBIN: 15 g/dL (ref 13.0–17.0)
MCH: 32.3 pg (ref 26.0–34.0)
MCHC: 35 g/dL (ref 30.0–36.0)
MCV: 92.5 fL (ref 78.0–100.0)
Platelets: 227 10*3/uL (ref 150–400)
RBC: 4.64 MIL/uL (ref 4.22–5.81)
RDW: 12.7 % (ref 11.5–15.5)
WBC: 7.5 10*3/uL (ref 4.0–10.5)

## 2014-12-30 LAB — URINE CULTURE: Colony Count: 15000

## 2014-12-30 LAB — CK: Total CK: 24832 U/L — ABNORMAL HIGH (ref 7–232)

## 2014-12-30 NOTE — Progress Notes (Signed)
[facility] PROGRESS NOTE  Keith BOYTE WUJ:[id number] DOB: 1991/08/28 DOA: 12/28/2014  PCP: Carollee Herter, MD  Brief HPI: 24 year old Caucasian male presented with severe pain in his upper arms along with some swelling. He had gone to the gym and lifted weights. He was found to have elevated CK consistent with rhabdomyolysis. Subsequently admitted to the hospital.  Past medical history:  Past Medical History  Diagnosis Date  . Allergy     RHINITIS  . ADD (attention deficit disorder)   . Acne     Consultants: None  Procedures: None  Antibiotics: None  Subjective: Patient feels better. Denies any pain. Has been ambulating.   Objective: Vital Signs  Filed Vitals:   12/29/14 1353 12/29/14 2154 12/30/14 0259 12/30/14 0519  BP: 135/62 133/70 124/69 130/59  Pulse: 109 88 85 77  Temp: 98.4 F (36.9 C) 98.1 F (36.7 C) 98 F (36.7 C) 97.8 F (36.6 C)  TempSrc: Oral Oral Oral Oral  Resp: Height:      Weight:      SpO2: 100% 100% 99% 100%    Intake/Output Summary (Last 24 hours) at 12/30/14 1042 Last data filed at 12/29/14 2300  Gross per 24 hour  Intake   2340 ml  Output    450 ml  Net   1890 ml   Filed Weights   12/29/14 0119  Weight: 122.335 kg (269 lb 11.2 oz)    General appearance: alert, cooperative, appears stated age and no distress Resp: clear to auscultation bilaterally Cardio: regular rate and rhythm, S1, S2 normal, no murmur, click, rub or gallop GI: soft, non-tender; bowel sounds normal; no masses,  no organomegaly   Lab Results:  Basic Metabolic Panel:  Recent Labs Lab 12/27/14 1454 12/28/14 0930 12/28/14 2259 12/29/14 0500 12/30/14 0526  NA 138 136 135 132* 140  K 5.1 4.9 4.3 3.9 4.3  CL 102 98 105 103 102  CO2 GLUCOSE 83 93 98 101* 100*  BUN CREATININE 0.73 0.74 0.67 0.70 0.68  CALCIUM 9.8 9.7 9.2 8.2* 9.2  MG  --   --   --  2.0  --   PHOS  --   --   --  3.5  --     Liver Function Tests:  Recent Labs Lab 12/27/14 1454 12/28/14 0930 12/28/14 2259 12/29/14 0500 12/30/14 0526  AST 1378* 1360* 1373* 1049* 708*  ALT 555* 650* 675* 581* 538*  ALKPHOS 64 70 68 55 54  BILITOT 0.9 0.8 0.8 1.0 1.0  PROT 7.4 7.2 7.8 6.3 6.5  ALBUMIN 4.6 4.4 4.7 3.8 3.9   CBC:  Recent Labs Lab 12/27/14 1454 12/28/14 0930 12/28/14 2259 12/29/14 0500 12/30/14 0526  WBC 9.0 7.7 12.1* 10.1 7.5  NEUTROABS 5.1  --  7.3  --   --   HGB 17.1* 17.0 17.4* 14.9 15.0  HCT 48.3 49.0 47.7 42.0 42.9  MCV 91.7 93.0 90.0 91.1 92.5  PLT 309 292 309 263 227   Cardiac Enzymes:  Recent Labs Lab 12/28/14 1102 12/28/14 2334 12/29/14 0500 12/29/14 1704 12/30/14 0526  CKTOTAL >50000* >50000* >50000* >50000* 95621*    Recent Results (from the past 240 hour(s))  Urine culture     Status: None (Preliminary result)   Collection Time: 12/27/14  2:58 PM  Result Value Ref Range Status   Colony Count 15,000 COLONIES/ML  Preliminary   Preliminary Report ESCHERICHIA COLI  Preliminary      Studies/Results: [place] Abdomen Limited Ruq  12/30/2014   CLINICAL DATA:  Initial encounter for elevated liver function tests.  EXAM: US ABDOMEN LIMITED - RIGHT UPPER QUADRANT  COMPARISON:  None.  FINDINGS: Gallbladder:  No gallstones or wall thickening visualized. No sonographic Murphy sign noted.  Common bile duct:  Diameter: Normal, 4 mm.  Liver:  Moderately increased in echogenicity.  No ascites.  IMPRESSION: 1. Increased hepatic echogenicity, most consistent with steatosis. 2. No biliary ductal dilatation or other explanation for elevated liver function tests.   Electronically Signed   By: Jeronimo GreavesKyle  Talbot M.D.   On: 12/30/2014 10:29    Medications:  Scheduled: . sodium chloride  3 mL Intravenous Q12H   Continuous: . sodium chloride 125 mL/hr at 12/30/14 0119   WJX:BJYNWGNFAOZPRN:ondansetron **OR** ondansetron (ZOFRAN) IV, traZODone  Assessment/Plan:  Active Problems:   Rhabdomyolysis   Elevated  LFTs    Rhabdomyolysis This is secondary to physical exertion at the gym. CK finally trending down. Improved this morning. Renal function continues to be normal. Continue IV fluids. Urine drug screen was unremarkable. 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 He's been told that he may need to stay off of work for at least a week to 10 days more.   Elevated LFTs Most likely secondary to muscular breakdown. Abdomen is benign without any hepatomegaly. Hepatitis panel was negative. Ultrasound showed hepatic steatosis. Patient notified. Although this is unrelated to his elevated LFTs. LFTs continued to improve.   DVT Prophylaxis: SCDs    Code Status: Full code  Family Communication: Discussed with the patient and his parents.  Disposition Plan: If CK continues to improve, he may be able to go home tomorrow.    LOS: 2 days   [facility] CenterKRISHNAN,Garrie Woodin  [facility] Pager [phone] 12/30/2014, 10:42 AM  If 7PM-7AM, please contact night-coverage at [email or web address], password [email or web address]

## 2014-12-31 LAB — COMPREHENSIVE METABOLIC PANEL
ALT: 409 U/L — ABNORMAL HIGH (ref 0–53)
AST: 587 U/L — ABNORMAL HIGH (ref 0–37)
Albumin: 3.4 g/dL — ABNORMAL LOW (ref 3.5–5.2)
Alkaline Phosphatase: 49 U/L (ref 39–117)
Anion gap: 8 (ref 5–15)
BUN: 13 mg/dL (ref 6–23)
CO2: 25 mmol/L (ref 19–32)
Calcium: 8.8 mg/dL (ref 8.4–10.5)
Chloride: 107 mEq/L (ref 96–112)
Creatinine, Ser: 0.74 mg/dL (ref 0.50–1.35)
GFR calc Af Amer: >90 mL/min (ref >90)
Glucose, Bld: 93 mg/dL (ref 70–99)
Potassium: 4 mmol/L (ref 3.5–5.1)
Sodium: 140 mmol/L (ref 135–145)
Total Bilirubin: 1 mg/dL (ref 0.3–1.2)
Total Protein: 5.8 g/dL — ABNORMAL LOW (ref 6.0–8.3)

## 2014-12-31 LAB — CK: Total CK: 6065 U/L — ABNORMAL HIGH (ref 7–232)

## 2014-12-31 MED ORDER — UNABLE TO FIND: Status: DC

## 2014-12-31 NOTE — Discharge Instructions (Signed)
Rhabdomyolysis °Rhabdomyolysis is the breakdown of muscle fibers due to injury. The injury may come from physical damage to the muscle like an injury but other causes are: °· High fever (hyperthermia). °· Seizures (convulsions). °· Low phosphate levels. °· Diseases of metabolism. °· Heatstroke. °· Drug toxicity. °· Over exertion. °· Alcoholism. °· Muscle is cut off from oxygen (anoxia). °· The squeezing of nerves and blood vessels (compartment syndrome). °Some drugs which may cause the breakdown of muscle are: °· Antibiotics. °· Statins. °· Alcohol. °· Animal toxins. °Myoglobin is a substance which helps muscle use oxygen. When the muscle is damaged, the myoglobin is released into the bloodstream. It is filtered out of the bloodstream by the kidneys. Myoglobin may block up the kidneys. This may cause damage, such as kidney failure. It also breaks down into other damaging toxic parts, which also cause kidney failure.  °SYMPTOMS  °· Dark, red, or tea colored urine. °· Weakness of affected muscles. °· Weight gain from water retention. °· Joint aches and pains. °· Irregular heart from high potassium in the blood. °· Muscle tenderness or aching. °· Generalized weakness. °· Seizures. °· Feeling tired (fatigue). °DIAGNOSIS  °Your caregiver may find muscle tenderness on exam and suspect the problem. Urine tests and blood work can confirm the problem. °TREATMENT     °· Early and aggressive treatment with large amounts of fluids may help prevent kidney failure. °· Water producing medicine (diuretic) may be used to help flush the kidneys. °· High potassium and calcium problems (electrolyte) in your blood may need treatment. °HOME CARE INSTRUCTIONS  °This problem is usually cared for in a hospital. If you are allowed to go home and require dialysis, make sure you keep all appointments for lab work and dialysis. Not doing so could result in death. °Document Released: 11/12/2004 Document Revised: 02/22/2012 Document Reviewed:  05/27/2009 °ExitCare® Patient Information ©2015 [employer], [employer]. This information is not intended to replace advice given to you by your health care provider. Make sure you discuss any questions you have with your health care provider. ° °

## 2014-12-31 NOTE — Discharge Summary (Signed)
[facility]  Physician Discharge Summary   Patient ID: Keith PoseyGregory C Rinella MRN: [id number] DOB/AGE: 1991/10/24 24 y.o.  Admit date: 12/28/2014 Discharge date: 12/31/2014  PCP: Carollee HerterLALONDE,JOHN CHARLES, MD  DISCHARGE DIAGNOSES:  Active Problems:   Rhabdomyolysis   Elevated LFTs   RECOMMENDATIONS FOR OUTPATIENT FOLLOW UP: 1. Complete metabolic panel including LFTs and total CK level on January 21st.  2. Follow-up with PCP for hepatic steatosis  DISCHARGE CONDITION: fair  Diet recommendation: Regular  Filed Weights   12/29/14 0119  Weight: 122.335 kg (269 lb 11.2 oz)    INITIAL HISTORY: 24 year old Caucasian male presented with severe pain in his upper arms along with some swelling. He had gone to the gym and lifted weights. He was found to have elevated CK consistent with rhabdomyolysis. Subsequently admitted to the hospital.  Consultations:  None  Procedures: None  HOSPITAL COURSE:   Rhabdomyolysis This is secondary to physical exertion at the gym. Initial CK was greater than 50,000. Patient was given IV fluids aggressively. Pain in his upper extremities slowly subsided. CKs finally started trending down. Today it's about 6000. He feels much better. Renal function is normal. He is urinating well. He is stable for discharge. His urine drug screen was unremarkable. He was also to stay off his work for one more week. He will need to have repeat blood work on Thursday.   Elevated LFTs Most likely secondary to muscular breakdown. Abdomen is benign without any hepatomegaly. Hepatitis panel was negative. Ultrasound showed hepatic steatosis. Patient notified and asked to discuss further with his PCP. Although this finding is unrelated to his elevated LFTs. LFTs continued to improve. Repeat blood work on Thursday.   Noted insignificant growth of Escherichia coli in the urine. Patient was asymptomatic. No need for treatment.  Overall, patient is much improved. He has been  ambulating. He does not have any further pain. He stable for discharge.  PERTINENT LABS:  The results of significant diagnostics from this hospitalization (including imaging, microbiology, ancillary and laboratory) are listed below for reference.    Microbiology: Recent Results (from the past 240 hour(s))  Urine culture     Status: None   Collection Time: 12/27/14  2:58 PM  Result Value Ref Range Status   Culture ESCHERICHIA COLI  Final   Colony Count 15,000 COLONIES/ML  Final   Organism ID, Bacteria ESCHERICHIA COLI  Final      Susceptibility   Escherichia coli -  (no method available)    AMPICILLIN <=2 Sensitive     AMOX/CLAVULANIC <=2 Sensitive     AMPICILLIN/SULBACTAM <=2 Sensitive     PIP/TAZO <=4 Sensitive     IMIPENEM <=0.25 Sensitive     CEFAZOLIN <=4 Sensitive     CEFTRIAXONE <=1 Sensitive     CEFTAZIDIME <=1 Sensitive     CEFEPIME <=1 Sensitive     GENTAMICIN <=1 Sensitive     TOBRAMYCIN <=1 Sensitive     CIPROFLOXACIN <=0.25 Sensitive     LEVOFLOXACIN <=0.12 Sensitive     NITROFURANTOIN <=16 Sensitive     TRIMETH/SULFA <=20 Sensitive      Labs: Basic Metabolic Panel:  Recent Labs Lab 12/28/14 0930 12/28/14 2259 12/29/14 0500 12/30/14 0526 12/31/14 0508  NA 136 135 132* 140 140  K 4.9 4.3 3.9 4.3 4.0  CL 98 105 103 102 107  CO2 29 25 26 27 25   GLUCOSE 93 98 101* 100* 93  BUN 7 9 9 11 13   CREATININE 0.74 0.67 0.70 0.68 0.74  CALCIUM  9.7 9.2 8.2* 9.2 8.8  MG  --   --  2.0  --   --   PHOS  --   --  3.5  --   --    Liver Function Tests:  Recent Labs Lab 12/28/14 0930 12/28/14 2259 12/29/14 0500 12/30/14 0526 12/31/14 0508  AST 1360* 1373* 1049* 708* 587*  ALT 650* 675* 581* 538* 409*  ALKPHOS 70 68 55 54 49  BILITOT 0.8 0.8 1.0 1.0 1.0  PROT 7.2 7.8 6.3 6.5 5.8*  ALBUMIN 4.4 4.7 3.8 3.9 3.4*   CBC:  Recent Labs Lab 12/27/14 1454 12/28/14 0930 12/28/14 2259 12/29/14 0500 12/30/14 0526  WBC 9.0 7.7 12.1* 10.1 7.5  NEUTROABS 5.1  --   7.3  --   --   HGB 17.1* 17.0 17.4* 14.9 15.0  HCT 48.3 49.0 47.7 42.0 42.9  MCV 91.7 93.0 90.0 91.1 92.5  PLT 309 292 309 263 227   Cardiac Enzymes:  Recent Labs Lab 12/28/14 2334 12/29/14 0500 12/29/14 1704 12/30/14 0526 12/31/14 0508  CKTOTAL >50000* >50000* >50000* 87564* 6065*    IMAGING STUDIES US Abdomen Limited Ruq  12/30/2014   CLINICAL DATA:  Initial encounter for elevated liver function tests.  EXAM: US ABDOMEN LIMITED - RIGHT UPPER QUADRANT  COMPARISON:  None.  FINDINGS: Gallbladder:  No gallstones or wall thickening visualized. No sonographic Murphy sign noted.  Common bile duct:  Diameter: Normal, 4 mm.  Liver:  Moderately increased in echogenicity.  No ascites.  IMPRESSION: 1. Increased hepatic echogenicity, most consistent with steatosis. 2. No biliary ductal dilatation or other explanation for elevated liver function tests.   Electronically Signed   By: Jeronimo Greaves M.D.   On: 12/30/2014 10:29    DISCHARGE EXAMINATION: Filed Vitals:   12/30/14 0519 12/30/14 1331 12/30/14 2137 12/31/14 0455  BP: 130/59 132/70 116/52 129/54  Pulse: 77 83 96 64  Temp: 97.8 F (36.6 C) 98.1 F (36.7 C) 97.7 F (36.5 C) 97.6 F (36.4 C)  TempSrc: Oral Oral Oral Oral  Resp: Height:      Weight:      SpO2: 100% 99% 99% 98%   Head: Normocephalic, without obvious abnormality, atraumatic Resp: clear to auscultation bilaterally Cardio: regular rate and rhythm, S1, S2 normal, no murmur, click, rub or gallop GI: soft, non-tender; bowel sounds normal; no masses,  no organomegaly Extremities: extremities normal, atraumatic, no cyanosis or edema  DISPOSITION: Home  Discharge Instructions    Call MD for:  difficulty breathing, headache or visual disturbances    Complete by:  As directed      Call MD for:  extreme fatigue    Complete by:  As directed      Call MD for:  persistant dizziness or light-headedness    Complete by:  As directed      Call MD for:  persistant  nausea and vomiting    Complete by:  As directed      Call MD for:  severe uncontrolled pain    Complete by:  As directed      Diet general    Complete by:  As directed      Discharge instructions    Complete by:  As directed   Please continue to hydrate yourself at home by drinking plenty of water/fluids. Go for blood work as written on prescription on 1/21.     Increase activity slowly    Complete by:  As directed  Lifting restrictions    Complete by:  As directed   No physical exertion/lifting heavy objects for 7 days.           ALLERGIES: No Known Allergies   Discharge Medication List as of 12/31/2014 10:31 AM    START taking these medications   Details  UNABLE TO FIND Blood Work on 01/03/15: CMET and Total CK. Dx: Transaminitis and Rhabdomyolysis. Results to PCP., Print       Follow-up Information    Follow up with Carollee Herter, MD. Schedule an appointment as soon as possible for a visit in 1 week.   Specialty:  Family Medicine   Why:  post hospitalization follow up   Contact information:   [address] [place] [address] [phone]       TOTAL DISCHARGE TIME: 35 minutes  [facility]  [facility] Pager [phone]  12/31/2014, 12:21 PM

## 2015-01-01 ENCOUNTER — Telehealth: Payer: Self-pay | Admitting: Family Medicine

## 2015-01-01 NOTE — Telephone Encounter (Signed)
I left a message for the patient to call back to schedule an appointment to follow up with Kristian CoveyShane Tysinger PA

## 2015-01-07 ENCOUNTER — Ambulatory Visit (INDEPENDENT_AMBULATORY_CARE_PROVIDER_SITE_OTHER): Payer: BLUE CROSS/BLUE SHIELD | Admitting: Medical

## 2015-01-07 ENCOUNTER — Encounter: Payer: Self-pay | Admitting: Medical

## 2015-01-07 VITALS — BP 120/82 | HR 103 | Temp 98.2°F | Resp 16 | Wt 262.0 lb

## 2015-01-07 DIAGNOSIS — R945 Abnormal results of liver function studies: Secondary | ICD-10-CM

## 2015-01-07 DIAGNOSIS — M6282 Rhabdomyolysis: Secondary | ICD-10-CM

## 2015-01-07 DIAGNOSIS — K760 Fatty (change of) liver, not elsewhere classified: Secondary | ICD-10-CM

## 2015-01-07 DIAGNOSIS — R7989 Other specified abnormal findings of blood chemistry: Secondary | ICD-10-CM

## 2015-01-07 NOTE — Progress Notes (Signed)
Subjective: Here for hospitalization f/u.  After his last visit here his liver tests and CK levels were still high and he was subsequently sent to the hospitalization for inpatient treatment of rhabdo. He was hospitalized 1/15 - 1/18 for rhabdo and LFTs elevated.   He is feeling much improved after all the IV hydration, no more muscle soreness, but still feeling fatigued somewhat.  Not like before though.  He is due to start back to work at [employer] Wednesday full schedule.  He has already talked with his supervisor who told him to take it easy, but is scheduled for regular 8 hour days.  He is a little worried about recurrence of symptoms.   He is also worried about hospital bills, how this happened in the first place. No other aggravating or relieving factors.  No other complaint.  Past Medical History  Diagnosis Date  . Allergy     RHINITIS  . ADD (attention deficit disorder)   . Acne    History reviewed. No pertinent past surgical history.  Review of Systems Constitutional: -fever, -chills, -sweats, -unexpected weight change,-fatigue ENT: -runny nose, -ear pain, -sore throat Cardiology:  -chest pain, -palpitations, -edema Respiratory: -cough, -shortness of breath, -wheezing Gastroenterology: -abdominal pain, -nausea, -vomiting, -diarrhea, -constipation  Hematology: -bleeding or bruising problems Musculoskeletal: -arthralgias, +myalgias, -joint swelling, -back pain Ophthalmology: -vision changes Neurology: +feels somewhat lethargic, -headache, -weakness, -tingling, -numbness    Objective: BP 120/82 mmHg  Pulse 103  Temp(Src) 98.2 F (36.8 C) (Oral)  Resp 16  Wt 262 lb (118.842 kg)  General appearance: alert, no distress, WD/WN, white male Skin is warm and dry No jaundice or icterus Oral cavity: MMM, no lesions Neck: supple, no lymphadenopathy, no thyromegaly, no masses Heart: RRR, normal S1, S2, no murmurs Lungs: CTA bilaterally, no wheezes, rhonchi, or rales Abdomen: +bs, soft,  non tender, non distended, no masses, no hepatomegaly, no splenomegaly Back: nontender Arms and legs nontender today Pulses: 2+ symmetric, upper and lower extremities, normal cap refill Ext: no edema   Assessment: Encounter Diagnoses  Name Primary?  . Non-traumatic rhabdomyolysis Yes  . Fatty liver     Plan: Clinically improved.   I think he may be overdoing it to go back full schedule Wednesday.   We discussed importance of good hydration throughout the day, eating healthy foods TID, listening to his body for aches, pains, fatigue.  I advised 1/2 day work schedule this week, then back to full time.  Advised restarting work more careful with lighter lifting, avoid overexertion, and take more frequent breaks.   Wrote detailed note for work today for work restrictions and precautions.  He will come by for labs in 2 weeks.   However, if any worse of symptoms, aches, fatigue, stop work and call.  advised to expect some fatigue since he has been on total rest the last week or 2 since we first saw him, but now that he is rehydrated, using caution, and plans to be more careful, he can resume work with the restrictions noted.  Call/return for any reason .  If doing fine, recheck for labs 2wk.

## 2015-04-23 IMAGING — US US ABDOMEN LIMITED
1 series · 14 of 25 positions shown · non-contrast
Comparison: None.

CLINICAL DATA: Initial encounter for elevated liver function tests.

EXAM:
US ABDOMEN LIMITED - RIGHT UPPER QUADRANT

[Series 1: us abdomen limited · 0.28mm/px · 14 of 26 slices shown]
[im 1/26]
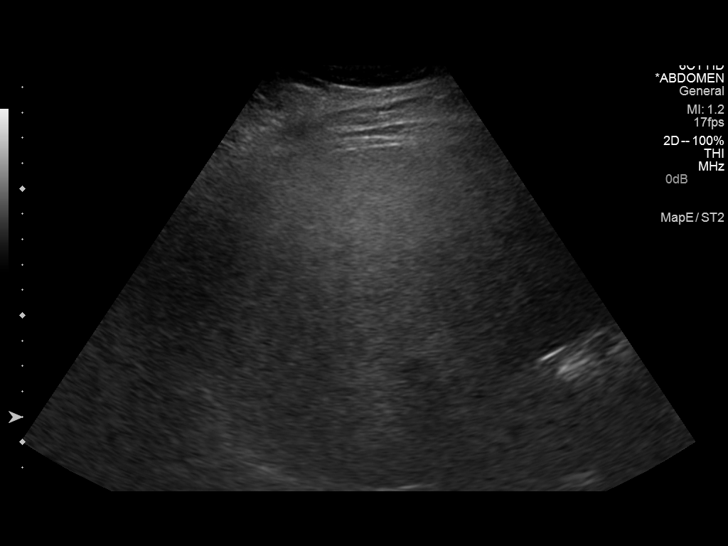
[im 3/26]
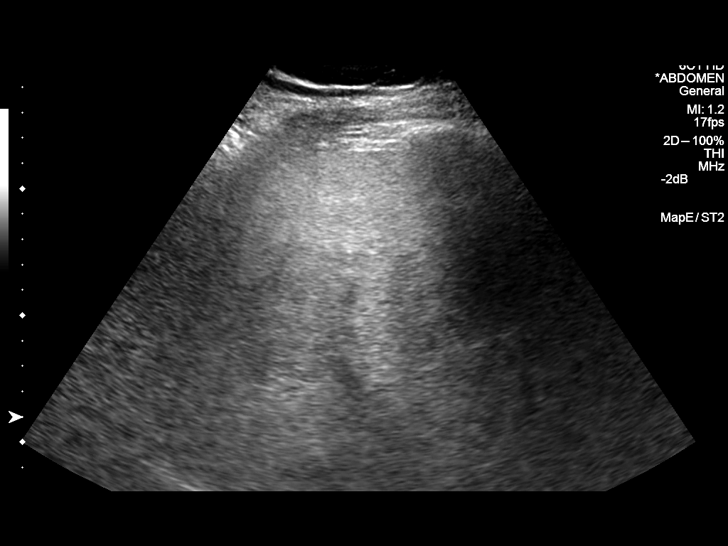
[im 5/26]
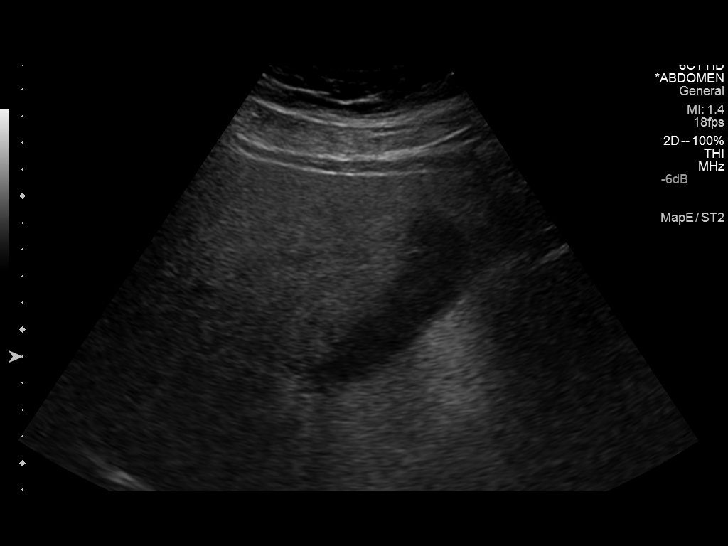
[im 7/26]
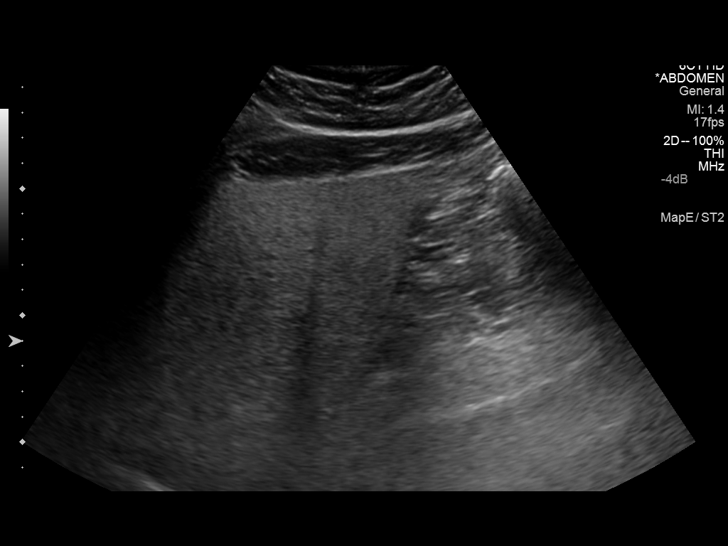
[im 9/26]
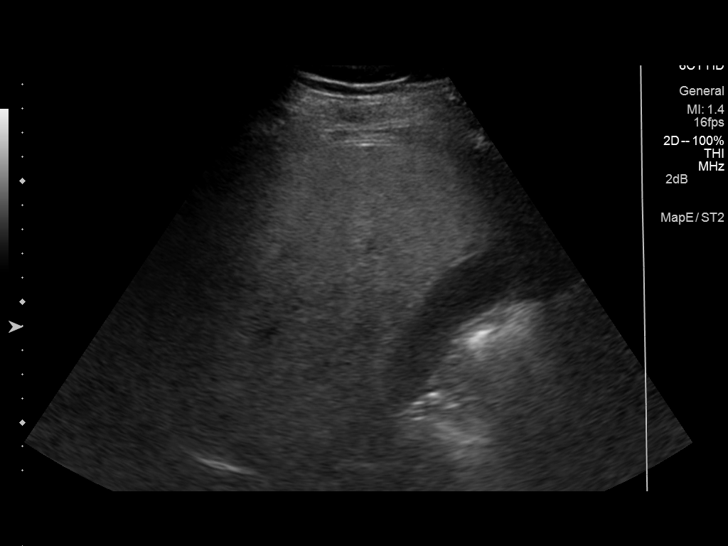
[im 10/26]
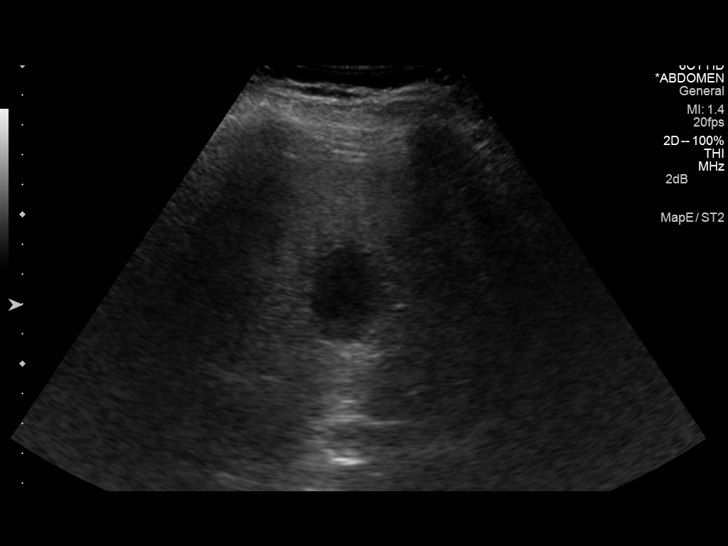
[im 12/26]
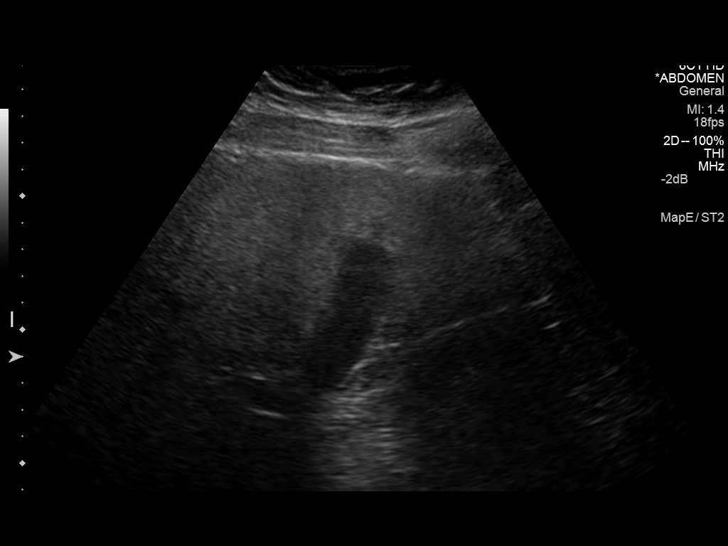
[im 14/26]
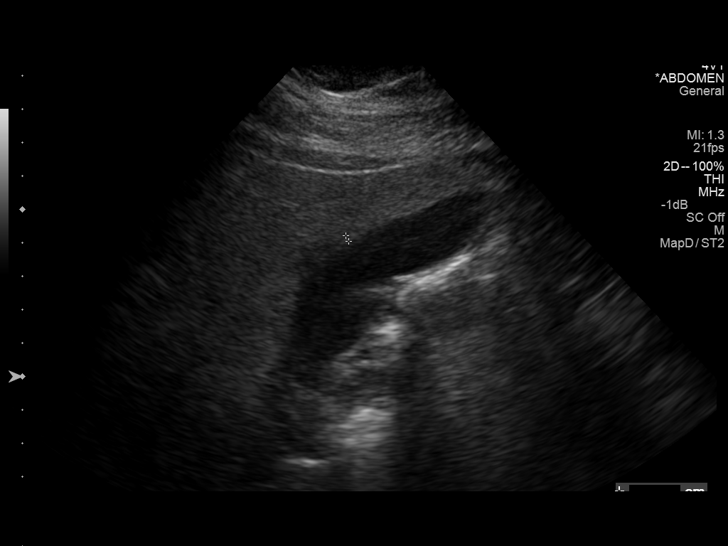
[im 16/26]
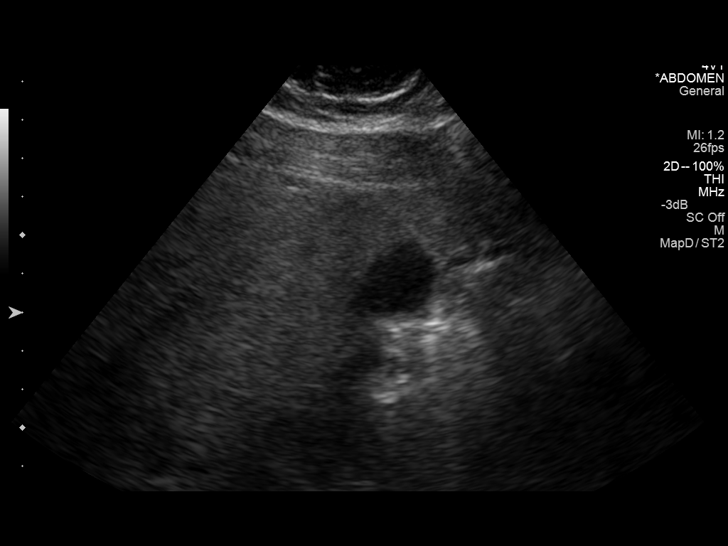
[im 17/26]
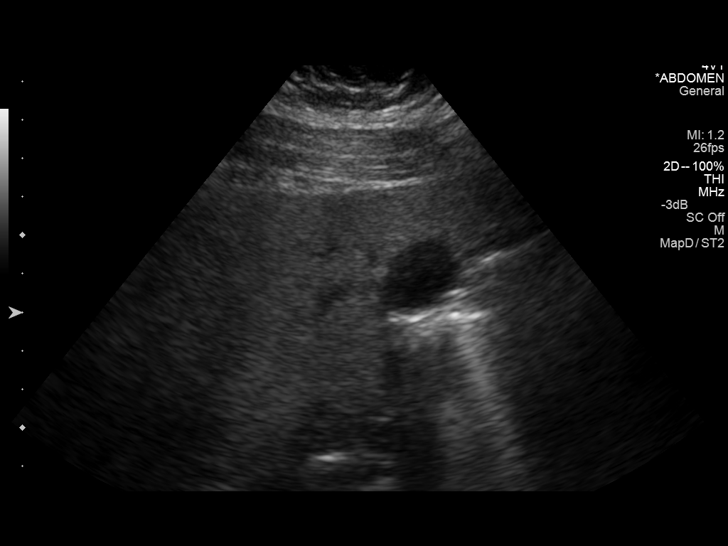
[im 19/26]
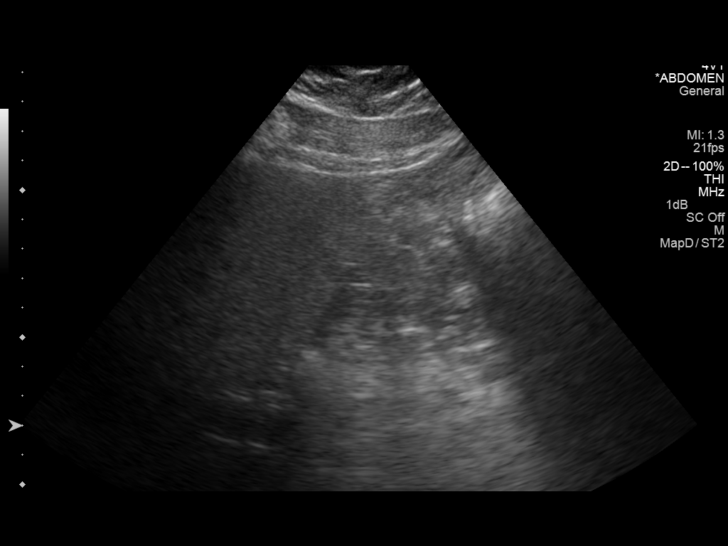
[im 21/26]
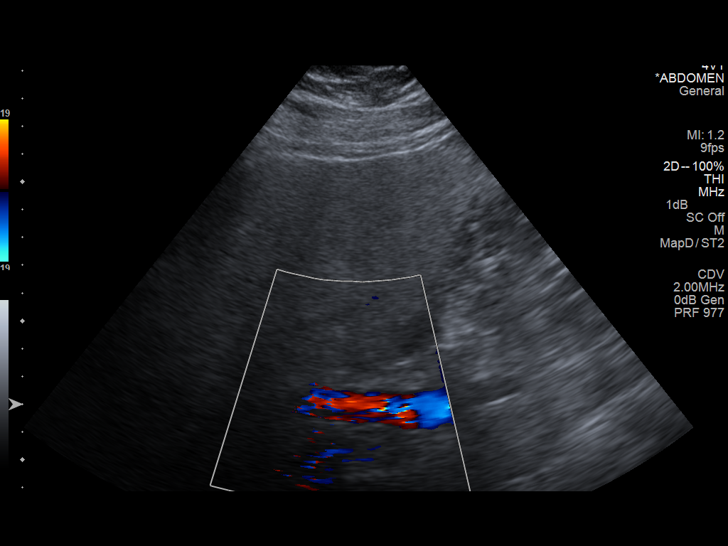
[im 23/26]
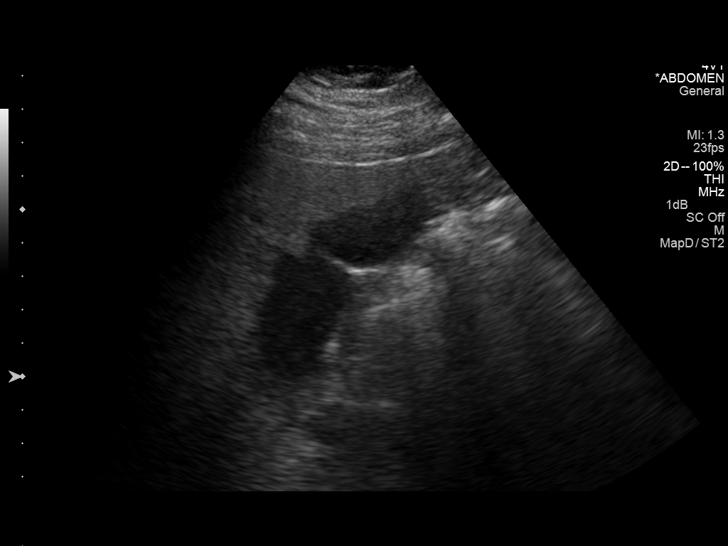
[im 26/26]
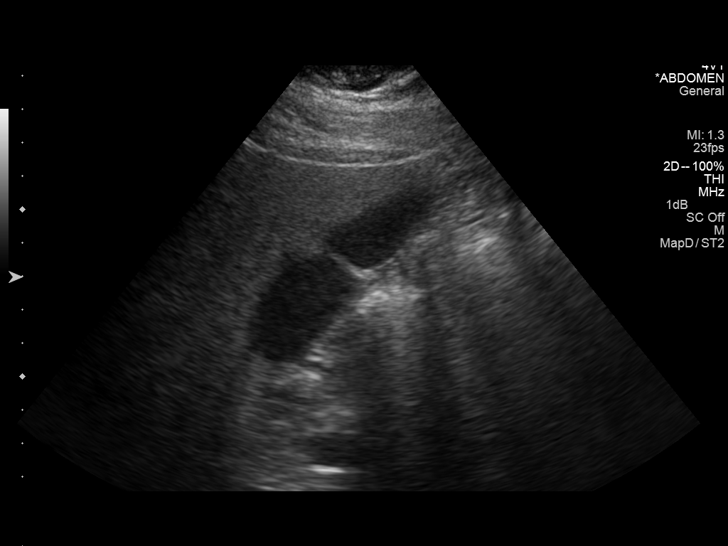

[14 of 25 positions shown; findings below may reference images not displayed]

FINDINGS: Gallbladder:

No gallstones or wall thickening visualized. No sonographic Murphy
sign noted.

Common bile duct:

Diameter: Normal, 4 mm.

Liver:

Moderately increased in echogenicity.  No ascites.
IMPRESSION: 1. Increased hepatic echogenicity, most consistent with steatosis.
2. No biliary ductal dilatation or other explanation for elevated
liver function tests.

## 2015-07-30 ENCOUNTER — Telehealth: Payer: Self-pay

## 2015-07-30 NOTE — Telephone Encounter (Signed)
Lets have him come back in for f/u on liver test.

## 2015-07-30 NOTE — Telephone Encounter (Signed)
Patient said he had labs done work ALT was 237 and [id number] pt would like to know what he needs to do

## 2015-07-31 ENCOUNTER — Telehealth: Payer: Self-pay | Admitting: Family Medicine

## 2015-07-31 NOTE — Telephone Encounter (Signed)
Pt called stating that he received our called asking him to make a f/u appt however pt say he has been living in [place] due to job and coming here for appt's has been difficult. He has been seeing a doctor as needed in [place] assigned thru his job so he would like to have records sent to that doctor and f/u with that doctor. Pt asked [place] to email a medical release form so that this process could be done quickly because e may not be in this area any time soon. His email is [email or web address]. Pt will send the completed medical release back to our office ASAP.

## 2015-07-31 NOTE — Telephone Encounter (Signed)
Left word for word message for pt to make appt.

## 2015-09-05 ENCOUNTER — Telehealth: Payer: Self-pay

## 2015-09-05 NOTE — Telephone Encounter (Signed)
Medical records sent out to [facility] in [place] on 09/05/15

## 2019-04-13 DIAGNOSIS — L219 Seborrheic dermatitis, unspecified: Secondary | ICD-10-CM | POA: Diagnosis not present

## 2019-06-27 DIAGNOSIS — Z03818 Encounter for observation for suspected exposure to other biological agents ruled out: Secondary | ICD-10-CM | POA: Diagnosis not present

## 2019-06-27 DIAGNOSIS — Z20828 Contact with and (suspected) exposure to other viral communicable diseases: Secondary | ICD-10-CM | POA: Diagnosis not present

## 2019-08-16 ENCOUNTER — Other Ambulatory Visit: Payer: Self-pay

## 2019-08-16 ENCOUNTER — Encounter: Payer: Self-pay | Admitting: Medical

## 2019-08-16 ENCOUNTER — Ambulatory Visit: Payer: BC Managed Care – PPO | Admitting: Medical

## 2019-08-16 VITALS — Ht 73.0 in | Wt 255.0 lb

## 2019-08-16 DIAGNOSIS — R05 Cough: Secondary | ICD-10-CM

## 2019-08-16 DIAGNOSIS — R059 Cough, unspecified: Secondary | ICD-10-CM

## 2019-08-16 DIAGNOSIS — R6889 Other general symptoms and signs: Secondary | ICD-10-CM

## 2019-08-16 NOTE — Progress Notes (Signed)
  Subjective:     Patient ID: Keith Olsen, male   DOB: [birth year], 28 y.o.   MRN: [id number]  This visit type was conducted due to national recommendations for restrictions regarding the COVID-19 Pandemic (e.g. social distancing) in an effort to limit this patient's exposure and mitigate transmission in our community.  Due to their co-morbid illnesses, this patient is at least at moderate risk for complications without adequate follow up.  This format is felt to be most appropriate for this patient at this time.    Documentation for virtual audio and video telecommunications through Zoom encounter:  The patient was located at home. The provider was located in the office. The patient did consent to this visit and is aware of possible charges through their insurance for this visit.  The other persons participating in this telemedicine service were none. Time spent on call was 15 minutes and in review of previous records >15 minutes total.  This virtual service is not related to other E/M service within previous 7 days.   HPI Chief Complaint  Patient presents with  . Sore Throat    congestion with some cough x1 day    Virtual consult today for sore throat and cough.  He notes 1 day history of congestion in the nasal passages, mild sore throat, a little bit of cough.  He denies fever, no nausea or vomiting, no body aches or chills, no change in sense of smell or taste.  Otherwise feels fine.  No COVID contact, there are sick contacts in general.  He was tested for COVID a month ago after someone in his housing community tested positive, he also quarantine a week at that time.  No other complaint.  He used little Benadryl today to help with congestion.  No other aggravating or relieving factors. No other complaint.  Review of Systems As in subjective    Objective:   Physical Exam Due to coronavirus pandemic stay at home measures, patient visit was virtual and they were not examined in  person.        Assessment:     Encounter Diagnoses  Name Primary?  . Cough Yes  . Throat and mouth symptom        Plan:     We discussed his symptoms and concerns.  His symptoms suggest a mild viral respiratory tract infection that may be beginning.  We discussed supportive care including salt water gargles, warm fluids, over-the-counter ibuprofen for pain, Benadryl for drainage, rest, hydration.  We discussed calling back if symptoms change or if new or worse symptoms occur.  He requested a work note.  I advised that since he has some respiratory symptoms that I cannot guarantee he does not have COVID, but I will write a work note suggesting diagnosis of mild URI and that he was evaluated today.  He requested work note sent to: [email or web address] Whyte was seen today for sore throat.  Diagnoses and all orders for this visit:  Cough  Throat and mouth symptom

## 2019-08-16 NOTE — Progress Notes (Signed)
Done

## 2019-08-18 DIAGNOSIS — J069 Acute upper respiratory infection, unspecified: Secondary | ICD-10-CM | POA: Diagnosis not present

## 2019-08-18 DIAGNOSIS — J029 Acute pharyngitis, unspecified: Secondary | ICD-10-CM | POA: Diagnosis not present

## 2019-08-18 DIAGNOSIS — R05 Cough: Secondary | ICD-10-CM | POA: Diagnosis not present

## 2019-08-18 DIAGNOSIS — Z20828 Contact with and (suspected) exposure to other viral communicable diseases: Secondary | ICD-10-CM | POA: Diagnosis not present

## 2020-07-18 DIAGNOSIS — U071 COVID-19: Secondary | ICD-10-CM | POA: Diagnosis not present

## 2020-07-18 DIAGNOSIS — M791 Myalgia, unspecified site: Secondary | ICD-10-CM | POA: Diagnosis not present

## 2020-07-18 DIAGNOSIS — R5383 Other fatigue: Secondary | ICD-10-CM | POA: Diagnosis not present

## 2020-07-18 DIAGNOSIS — R05 Cough: Secondary | ICD-10-CM | POA: Diagnosis not present

## 2021-03-31 DIAGNOSIS — L400 Psoriasis vulgaris: Secondary | ICD-10-CM | POA: Diagnosis not present

## 2021-03-31 DIAGNOSIS — L218 Other seborrheic dermatitis: Secondary | ICD-10-CM | POA: Diagnosis not present

## 2021-05-19 DIAGNOSIS — L218 Other seborrheic dermatitis: Secondary | ICD-10-CM | POA: Diagnosis not present

## 2021-10-11 DIAGNOSIS — W57XXXA Bitten or stung by nonvenomous insect and other nonvenomous arthropods, initial encounter: Secondary | ICD-10-CM | POA: Diagnosis not present

## 2021-10-11 DIAGNOSIS — L089 Local infection of the skin and subcutaneous tissue, unspecified: Secondary | ICD-10-CM | POA: Diagnosis not present

## 2021-10-11 DIAGNOSIS — S60561A Insect bite (nonvenomous) of right hand, initial encounter: Secondary | ICD-10-CM | POA: Diagnosis not present

## 2022-05-19 DIAGNOSIS — L408 Other psoriasis: Secondary | ICD-10-CM | POA: Diagnosis not present

## 2022-05-19 DIAGNOSIS — L218 Other seborrheic dermatitis: Secondary | ICD-10-CM | POA: Diagnosis not present

## 2023-04-02 ENCOUNTER — Emergency Department (HOSPITAL_BASED_OUTPATIENT_CLINIC_OR_DEPARTMENT_OTHER)
Admission: EM | Admit: 2023-04-02 | Discharge: 2023-04-02 | Disposition: A | Discharge status: Home / Self Care | Payer: BC Managed Care – PPO | Attending: Emergency Medicine | Admitting: Emergency Medicine

## 2023-04-02 ENCOUNTER — Other Ambulatory Visit (HOSPITAL_BASED_OUTPATIENT_CLINIC_OR_DEPARTMENT_OTHER): Payer: Self-pay

## 2023-04-02 ENCOUNTER — Ambulatory Visit: Payer: BC Managed Care – PPO | Admitting: Nurse Practitioner

## 2023-04-02 ENCOUNTER — Emergency Department (HOSPITAL_BASED_OUTPATIENT_CLINIC_OR_DEPARTMENT_OTHER): Payer: BC Managed Care – PPO

## 2023-04-02 ENCOUNTER — Encounter (HOSPITAL_BASED_OUTPATIENT_CLINIC_OR_DEPARTMENT_OTHER): Payer: Self-pay | Admitting: Emergency Medicine

## 2023-04-02 ENCOUNTER — Other Ambulatory Visit: Payer: Self-pay

## 2023-04-02 DIAGNOSIS — S39011A Strain of muscle, fascia and tendon of abdomen, initial encounter: Secondary | ICD-10-CM | POA: Diagnosis not present

## 2023-04-02 DIAGNOSIS — R1033 Periumbilical pain: Secondary | ICD-10-CM

## 2023-04-02 DIAGNOSIS — S29012A Strain of muscle and tendon of back wall of thorax, initial encounter: Secondary | ICD-10-CM | POA: Diagnosis not present

## 2023-04-02 DIAGNOSIS — T148XXA Other injury of unspecified body region, initial encounter: Secondary | ICD-10-CM

## 2023-04-02 DIAGNOSIS — X509XXA Other and unspecified overexertion or strenuous movements or postures, initial encounter: Secondary | ICD-10-CM | POA: Insufficient documentation

## 2023-04-02 DIAGNOSIS — M546 Pain in thoracic spine: Secondary | ICD-10-CM | POA: Diagnosis not present

## 2023-04-02 DIAGNOSIS — N289 Disorder of kidney and ureter, unspecified: Secondary | ICD-10-CM | POA: Diagnosis not present

## 2023-04-02 DIAGNOSIS — Y990 Civilian activity done for income or pay: Secondary | ICD-10-CM | POA: Diagnosis not present

## 2023-04-02 DIAGNOSIS — R109 Unspecified abdominal pain: Secondary | ICD-10-CM | POA: Diagnosis not present

## 2023-04-02 LAB — CBC WITH DIFFERENTIAL/PLATELET
Abs Immature Granulocytes: 0.02 10*3/uL (ref 0.00–0.07)
Basophils Absolute: 0 10*3/uL (ref 0.0–0.1)
Basophils Relative: 1 %
Eosinophils Absolute: 0.3 10*3/uL (ref 0.0–0.5)
Eosinophils Relative: 5 %
HCT: 44.8 % (ref 39.0–52.0)
Hemoglobin: 16.1 g/dL (ref 13.0–17.0)
Immature Granulocytes: 0 %
Lymphocytes Relative: 30 %
Lymphs Abs: 1.6 10*3/uL (ref 0.7–4.0)
MCH: 31.5 pg (ref 26.0–34.0)
MCHC: 35.9 g/dL (ref 30.0–36.0)
MCV: 87.7 fL (ref 80.0–100.0)
Monocytes Absolute: 0.4 10*3/uL (ref 0.1–1.0)
Monocytes Relative: 6 %
Neutro Abs: 3.2 10*3/uL (ref 1.7–7.7)
Neutrophils Relative %: 58 %
Platelets: 252 10*3/uL (ref 150–400)
RBC: 5.11 MIL/uL (ref 4.22–5.81)
RDW: 12.4 % (ref 11.5–15.5)
WBC: 5.5 10*3/uL (ref 4.0–10.5)
nRBC: 0 % (ref 0.0–0.2)

## 2023-04-02 LAB — URINALYSIS, ROUTINE W REFLEX MICROSCOPIC
Bilirubin Urine: NEGATIVE
Glucose, UA: NEGATIVE mg/dL
Hgb urine dipstick: NEGATIVE
Ketones, ur: NEGATIVE mg/dL
Leukocytes,Ua: NEGATIVE
Nitrite: NEGATIVE
Protein, ur: NEGATIVE mg/dL
Specific Gravity, Urine: 1.033 — ABNORMAL HIGH (ref 1.005–1.030)
pH: 6 (ref 5.0–8.0)

## 2023-04-02 LAB — COMPREHENSIVE METABOLIC PANEL
ALT: 60 U/L — ABNORMAL HIGH (ref 0–44)
AST: 26 U/L (ref 15–41)
Albumin: 4.1 g/dL (ref 3.5–5.0)
Alkaline Phosphatase: 53 U/L (ref 38–126)
Anion gap: 8 (ref 5–15)
BUN: 11 mg/dL (ref 6–20)
CO2: 25 mmol/L (ref 22–32)
Calcium: 9.1 mg/dL (ref 8.9–10.3)
Chloride: 108 mmol/L (ref 98–111)
Creatinine, Ser: 0.84 mg/dL (ref 0.61–1.24)
GFR, Estimated: >60 mL/min (ref >60)
Glucose, Bld: 129 mg/dL — ABNORMAL HIGH (ref 70–99)
Potassium: 4 mmol/L (ref 3.5–5.1)
Sodium: 141 mmol/L (ref 135–145)
Total Bilirubin: 0.7 mg/dL (ref 0.3–1.2)
Total Protein: 6.9 g/dL (ref 6.5–8.1)

## 2023-04-02 LAB — CK: Total CK: 64 U/L (ref 49–397)

## 2023-04-02 LAB — LIPASE, BLOOD: Lipase: 30 U/L (ref 11–51)

## 2023-04-02 MED ORDER — NAPROXEN 500 MG PO TABS
500.0000 mg | ORAL_TABLET | Freq: Two times a day (BID) | ORAL | 0 refills | Status: DC
  Filled 2023-04-02: qty 30, 15d supply, fill #0

## 2023-04-02 MED ORDER — HYDROMORPHONE HCL 1 MG/ML IJ SOLN
1.0000 mg | Freq: Once | INTRAMUSCULAR | Status: AC
  Administered 2023-04-02: 1 mg via INTRAVENOUS
  Filled 2023-04-02: qty 1

## 2023-04-02 MED ORDER — IOHEXOL 300 MG/ML  SOLN
100.0000 mL | Freq: Once | INTRAMUSCULAR | Status: AC | PRN
  Administered 2023-04-02: 80 mL via INTRAVENOUS

## 2023-04-02 MED ORDER — METHOCARBAMOL 750 MG PO TABS
750.0000 mg | ORAL_TABLET | Freq: Four times a day (QID) | ORAL | 0 refills | Status: DC | PRN
  Filled 2023-04-02: qty 30, 8d supply, fill #0

## 2023-04-02 MED ORDER — LACTATED RINGERS IV BOLUS
1000.0000 mL | Freq: Once | INTRAVENOUS | Status: AC
  Administered 2023-04-02: 1000 mL via INTRAVENOUS

## 2023-04-02 MED ORDER — IBUPROFEN 800 MG PO TABS
800.0000 mg | ORAL_TABLET | Freq: Once | ORAL | Status: AC
  Administered 2023-04-02: 800 mg via ORAL
  Filled 2023-04-02: qty 1

## 2023-04-02 MED ORDER — METHOCARBAMOL 500 MG PO TABS
750.0000 mg | ORAL_TABLET | Freq: Once | ORAL | Status: AC
  Administered 2023-04-02: 750 mg via ORAL
  Filled 2023-04-02: qty 2

## 2023-04-02 NOTE — ED Provider Notes (Signed)
[place] EMERGENCY DEPARTMENT AT [facility] Provider Note   CSN: [id number] Arrival date & time: 04/02/23  7829     History  Chief Complaint  Patient presents with   Back Pain    Keith Olsen is a 32 y.o. male.  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  He reports he thought it would just go away.  However, he has gotten much more intense deeper pain in the abdomen and also his low back is very painful.  He reports it hurts a lot to try to sit up.  Standing for any period of time hurts.  His best position of comfort is semirecumbent.  Patient denies he has had any vomiting or difficulty with bowel or bladder function.  He reports he has been able to eat without exacerbation of pain.  He last ate yesterday evening.  No weakness or numbness into the legs.  Patient is non-smoker.  Rare alcohol use.  No regular use of NSAIDs.  No personal history of peptic ulcer disease or gastritis.    Home Medications Prior to Admission medications   Medication Sig Start Date End Date Taking? Authorizing Provider  levocetirizine (XYZAL) 5 MG tablet  03/10/19   [provider]  methocarbamol (ROBAXIN-750) 750 MG tablet Take 1 tablet (750 mg total) by mouth every 6 (six) hours as needed for muscle spasms. 04/02/23  Yes Arby Barrette, MD  naproxen (NAPROSYN) 500 MG tablet Take 1 tablet (500 mg total) by mouth 2 (two) times daily. 04/02/23  Yes Arby Barrette, MD  UNABLE TO FIND Blood Work on 01/03/15: CMET and Total CK. Dx: Transaminitis and Rhabdomyolysis. Results to PCP. Patient not taking: Reported on 08/16/2019 12/31/14   Osvaldo Shipper, MD      Allergies    Patient has no known allergies.    Review of Systems   Review of Systems  Physical Exam Updated Vital Signs BP 139/70   Pulse 99   Temp 97.8 F (36.6 C) (Oral)   Resp 20   Ht  (1.854 m)   Wt 122.5 kg    SpO2 98%   BMI 35.62 kg/m  Physical Exam Constitutional:      Comments: Alert nontoxic.  Well-nourished well-developed.  Patient is very uncomfortable in appearance.  No respiratory distress.  HENT:     Mouth/Throat:     Pharynx: Oropharynx is clear.  Eyes:     Extraocular Movements: Extraocular movements intact.  Cardiovascular:     Rate and Rhythm: Normal rate and regular rhythm.  Pulmonary:     Effort: Pulmonary effort is normal.     Breath sounds: Normal breath sounds.  Abdominal:     Comments: Abdomen is soft.  I cannot appreciate any palpable mass.  No palpable mass at the umbilicus.  Palpation is not reproducing pain for the patient.  No mass fullness or tenderness in the inguinal region.  Musculoskeletal:     Comments: Normal visual inspection of the back.  No focal reproducible bony point tenderness.  No peripheral edema.  Calves are soft and nontender.  Skin:    General: Skin is warm and dry.  Neurological:     General: No focal deficit present.     Mental Status: He is oriented to person, place, and time.     Coordination: Coordination normal.  Psychiatric:  Mood and Affect: Mood normal.     ED Results / Procedures / Treatments   Labs (all labs ordered are listed, but only abnormal results are displayed) Labs Reviewed  COMPREHENSIVE METABOLIC PANEL - Abnormal; Notable for the following components:      Result Value   Glucose, Bld 129 (*)    ALT 60 (*)    All other components within normal limits  URINALYSIS, ROUTINE W REFLEX MICROSCOPIC - Abnormal; Notable for the following components:   Specific Gravity, Urine 1.033 (*)    All other components within normal limits  LIPASE, BLOOD  CBC WITH DIFFERENTIAL/PLATELET  CK    EKG None  Radiology CT ABDOMEN PELVIS W CONTRAST  Result Date: 04/02/2023 CLINICAL DATA:  Abdominal pain as well as bilateral low back pain since yesterday. EXAM: CT ABDOMEN AND PELVIS WITH CONTRAST TECHNIQUE: Multidetector CT  imaging of the abdomen and pelvis was performed using the standard protocol following bolus administration of intravenous contrast. RADIATION DOSE REDUCTION: This exam was performed according to the departmental dose-optimization program which includes automated exposure control, adjustment of the mA and/or kV according to patient size and/or use of iterative reconstruction technique. CONTRAST:  80mL OMNIPAQUE IOHEXOL 300 MG/ML  SOLN COMPARISON:  None Available. FINDINGS: Lower chest: Lung bases are clear. Hepatobiliary: Liver, gallbladder and biliary tree are normal. Pancreas: Normal. Spleen: Normal. Adrenals/Urinary Tract: Adrenal glands are normal. Kidneys are normal in size. There is no hydronephrosis or nephrolithiasis. Subtle somewhat linear low-attenuation over the mid to lower pole cortex of the right kidney which is indeterminate. This finding could be seen with early infection or early cyst formation. No perinephric inflammation or fluid. Subcentimeter hypodensity over the mid pole left kidney too small to characterize but likely a cyst for which no follow-up imaging is recommended. Ureters and bladder are normal. Stomach/Bowel: Stomach and small bowel are normal. Appendix is normal. Colon is normal. Vascular/Lymphatic: Abdominal aorta is normal in caliber. No adenopathy. Reproductive: Normal. Other: No free fluid or focal inflammatory change. Musculoskeletal: No focal abnormality. IMPRESSION: 1. No acute findings in the abdomen/pelvis. 2. Subtle somewhat linear low-attenuation over the mid to lower pole cortex of the right kidney which is indeterminate. This finding could be seen with early infection or early cyst formation. Recommend clinical correlation. 3. Subcentimeter hypodensity over the mid pole left kidney too small to characterize, but likely a cyst for which no follow-up imaging is recommended. Electronically Signed   By: Elberta Fortis M.D.   On: 04/02/2023 12:41    Procedures Procedures     Medications Ordered in ED Medications  lactated ringers bolus 1,000 mL (0 mLs Intravenous Stopped 04/02/23 1307)  HYDROmorphone (DILAUDID) injection 1 mg (1 mg Intravenous Given 04/02/23 1101)  iohexol (OMNIPAQUE) 300 MG/ML solution 100 mL (80 mLs Intravenous Contrast Given 04/02/23 1208)  ibuprofen (ADVIL) tablet 800 mg (800 mg Oral Given 04/02/23 1306)  methocarbamol (ROBAXIN) tablet 750 mg (750 mg Oral Given 04/02/23 1306)    ED Course/ Medical Decision Making/ A&P                             Medical Decision Making Amount and/or Complexity of Data Reviewed Labs: ordered. Radiology: ordered.  Risk Prescription drug management.   Patient presents with deep abdominal pain and back pain.  Patient associates the onset with a episode of heavy exertion.  Differential diagnosis includes hernia, umbilical or internal\intra-abdominal or back musculoskeletal strain\psoas hematoma\pyelonephritis\rhabdomyolysis.  Will proceed with  diagnostic evaluation including CT scan abdomen pelvis.  Patient is very uncomfortable will ministered Dilaudid and IV fluids while pursuing diagnostic evaluation.  Urinalysis negative.  No blood no leukocytes.  Routine chemistry panel normal except CBG 129.  Normal GFR.  CK 64.  CBC normal with normal differential.  CT scan reviewed by radiology no acute findings identified.  Radiologist notes an indeterminate finding of low-density in the right kidney to correlate with clinical findings.  At this time, there is no clinical correlation.  Patient's urinalysis is normal.  Symptoms were of acute onset.  GFR normal.  Patient has been informed of the finding and to have PCP follow-up.  Patient got good pain relief with Dilaudid, Robaxin and ibuprofen.  Patient was rehydrated with lactated Ringer's.  Patient had prior history of significant rhabdomyolysis after intense exercise event.  At this time no evidence of rhabdomyolysis.  Patient did trigger the event was some  intense physical straining however kidney function normal CK normal.  Based on patient's history and all diagnostic evaluation, I have high suspicion for significant abdominal and back strain of deep musculature.  Patient describes the trigger for this is being trying to lift or push a stuck utility vehicle at work.  No evidence of hernia on physical exam or on CT scan.  Plan at this time will be to treat with naproxen and Robaxin.  I have reviewed the plan with the patient he is to follow-up with PCP for recheck and return precautions reviewed for fever, suddenly or increasing pain or other concerning changes.        Final Clinical Impression(s) / ED Diagnoses Final diagnoses:  Periumbilical abdominal pain  Acute bilateral thoracic back pain  Musculoskeletal strain    Rx / DC Orders ED Discharge Orders          Ordered    naproxen (NAPROSYN) 500 MG tablet  2 times daily        04/02/23 1344    methocarbamol (ROBAXIN-750) 750 MG tablet  Every 6 hours PRN        04/02/23 1344              Arby Barrette, MD 04/02/23 1353

## 2023-04-02 NOTE — Discharge Instructions (Signed)
1.  At this time I suspect your pain is due to muscular strain deep in the abdominal wall and back.  Your CT scan did not indicate any immediate abnormalities.  Radiologist noted an area of slight inflammation by one of the kidneys.  At this time there is no sign of kidney infection based on urinalysis or your symptoms.  Follow-up with your doctor to review this and return to the emergency department if you get fevers, pain or burning with urination or other concerning changes. 2.  At this time, the plan will be to take naproxen twice a day for the next 3 to 4 days and then as needed.  If you need additional pain control you may take extra strength Tylenol as per package instructions every 6 hours.  You may also take Robaxin, a muscle relaxer with these medications. 3.  Return to the emergency department if you get fevers, vomiting, worsening or changing pain, weakness or numbness into the legs or other concerning changes.

## 2023-04-02 NOTE — ED Notes (Signed)
Urinal given and pt advised that urine sample is needed 

## 2023-04-02 NOTE — ED Triage Notes (Signed)
Bilateral low back pain radiating around into abd since yesterday. Describes pain as shooting.

## 2023-04-05 ENCOUNTER — Encounter: Payer: Self-pay | Admitting: Medical

## 2023-04-05 ENCOUNTER — Ambulatory Visit: Payer: BC Managed Care – PPO | Admitting: Medical

## 2023-04-05 VITALS — BP 110/68 | HR 77 | Temp 97.7°F | Wt 290.8 lb

## 2023-04-05 DIAGNOSIS — S39012D Strain of muscle, fascia and tendon of lower back, subsequent encounter: Secondary | ICD-10-CM | POA: Diagnosis not present

## 2023-04-05 DIAGNOSIS — M62830 Muscle spasm of back: Secondary | ICD-10-CM | POA: Diagnosis not present

## 2023-04-05 NOTE — Progress Notes (Signed)
Subjective:  Keith Olsen is a 32 y.o. male who presents for Chief Complaint  Patient presents with   back pain    Back pain, went to ER Friday, still having a lot of pain     Here for emergency dept followup.  Went to ED 04/01/21.   Weekend before last was hauling a bunch of dirt that weekend. This this past week was helping push a UTV up on a trailer and started having pain.  The last few days of last week could barely walk due to pain.  Was having random shots of sever back pain, which led him to go to the emergency dept.   Was evaluated, had CT abdomen and pelvis, labs, and was prescribed Naprosyn and Robaxin muscle relaxer.   Did get a shot of Dilaudid at the emergency dept.   Feels 50% improved since ED visit.   Is worried about it flaring up again.    No other aggravating or relieving factors.    No other c/o.  The following portions of the patient's history were reviewed and updated as appropriate: allergies, current medications, past family history, past medical history, past social history, past surgical history and problem list.  ROS Otherwise as in subjective above    Objective: BP 110/68   Pulse 77   Temp 97.7 F (36.5 C)   Wt 290 lb 12.8 oz (131.9 kg)   BMI 38.37 kg/m   General appearance: alert, no distress, well developed, well nourished Back: Tender thoracic to lumbar spine paraspinal, range of motion is slow but about 90% of normal, he does note pain with range of motion in general, no obvious swelling or deformity otherwise MSK: Arms and legs with normal range of motion, nontender, no obvious deformity Pulses: 2+ radial pulses, 2+ pedal pulses, normal cap refill Ext: no edema Arms and legs neurovascularly intact   Assessment: Encounter Diagnoses  Name Primary?   Back strain, subsequent encounter Yes   Back spasm      Plan: I reviewed his emergency department notes, CT scan labs from 04/02/2023.  Diagnosis is abdominal pain, back pain and strain  and spasm  We discussed usual timeframe to see improvement after back sprain and spasm.  Advised to go ahead and continue Naprosyn the rest of the week today Monday through this Friday, continue muscle relaxer as needed with caution with sedation.  Continue gentle range of motion activities and stretching this week.  Out of work today and tomorrow.  Light duty the remainder of the week.  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  Diagnoses and all orders for this visit:  Back strain, subsequent encounter  Back spasm    Follow up: prn

## 2023-07-23 ENCOUNTER — Other Ambulatory Visit: Payer: Self-pay

## 2023-07-23 DIAGNOSIS — S5011XA Contusion of right forearm, initial encounter: Secondary | ICD-10-CM

## 2023-08-17 ENCOUNTER — Other Ambulatory Visit: Payer: BC Managed Care – PPO

## 2023-09-28 ENCOUNTER — Ambulatory Visit: Payer: BC Managed Care – PPO | Admitting: Medical

## 2023-09-28 VITALS — BP 110/70 | HR 86 | Ht 72.0 in | Wt 283.6 lb

## 2023-09-28 DIAGNOSIS — G479 Sleep disorder, unspecified: Secondary | ICD-10-CM | POA: Diagnosis not present

## 2023-09-28 DIAGNOSIS — R4589 Other symptoms and signs involving emotional state: Secondary | ICD-10-CM | POA: Diagnosis not present

## 2023-09-28 DIAGNOSIS — Z6838 Body mass index (BMI) 38.0-38.9, adult: Secondary | ICD-10-CM | POA: Diagnosis not present

## 2023-09-28 DIAGNOSIS — G478 Other sleep disorders: Secondary | ICD-10-CM

## 2023-09-28 DIAGNOSIS — R0683 Snoring: Secondary | ICD-10-CM | POA: Diagnosis not present

## 2023-09-28 MED ORDER — FLUOXETINE HCL 10 MG PO CAPS: 10.0000 mg | ORAL_CAPSULE | Freq: Every day | ORAL | 0 refills | Status: DC

## 2023-09-28 MED ORDER — FLUOXETINE HCL 20 MG PO CAPS: 20.0000 mg | ORAL_CAPSULE | Freq: Every day | ORAL | 1 refills | Status: DC

## 2023-09-28 NOTE — Patient Instructions (Signed)
Counseling Services  [facility] Regal [phone] [address] [place], [place] [address]   [facility], counseling [address] [place], [place] [address] [phone]   [facility] [phone] [address],  [place], [place] [address]   Dr. Len Blalock Child and teen psychiatrist [address], [place], [place] [address] [phone]   Dr. Nicole Cella, [place] [phone] [place], [place] [address]   [facility] [address] [place], [place] [address] [phone]    [facility] Crisis Line and Main phone number [phone]  [facility]  [phone]  [facility] [phone]  Adult Crisis Center [phone]   [facility] [address] [place], [place], [place] [address] [phone]  [facility],  [address],  [place], [place] [address] [phone]    [facility] [phone] [address] [place], [place], [place] [address]   [facility] [phone] office [address] [place], [place] [address] Crisis services, Family support, in home therapy, treatment for Anxiety, PTSD, Sexual Assault, Substance Abuse, Financial/Credit Counseling, Variety of other services     Other Resources  If you are experiencing a mental health crisis or an emergency, please call 911 or go to the nearest emergency department.  [facility]   [phone] [facility]  [phone] [facility]   [phone]  Suicide Hotline 1-800-Suicide (630)410-8066)  National Suicide Prevention Lifeline 623-658-6650  (508)507-8219)  Domestic Violence, Rape/Crisis - [facility] [place] 892-119-4174  [employer] Violence Hotline 1-800-799-SAFE 618-116-3114)  To report Child or Elder Abuse, please call: [facility] Police Department  [phone] [facility] Department  [phone]  Teen Crisis line [phone] or [phone]

## 2023-09-28 NOTE — Progress Notes (Signed)
Sent order over already

## 2023-09-28 NOTE — Progress Notes (Signed)
Subjective:  Keith Olsen is a 32 y.o. male who presents for Chief Complaint  Patient presents with   Consult    Sleep issues- snoring very loud, fatigue Depression- long time, seen a therapist before but stopped due to financial issues     Here for some concerns  Having some issues with sleep.  Has been told he snores loud.  Girlfriend has mentioned loud snoring but no obvious witnessed apnea.  No prior sleep evaluation.  Working 40 hours per week.  Goes to bed around 9pm.  Gets up 4:45am every day, working in [place].  Doesn't feel rested in the morning.  Sometimes gets sleepy in the day but usually not.    Father currently has bladder cancer.  Been off work the last 2 weeks helping him.   Having issues with depressed mood for a long time as well as anxious mood.  Gets moody, angry at times, and when crying fits, feels like a zombie sometimes.  Feels anxiety a lot.  He does not want to feel this way but has dealt with this for years.  He denies prior medication just Prior.  This interferes with relationships.  No SI/HI or plan  Has done therapy in the past but it was costly, can't afford this.  Per orthopedics, he was recently started gabapentin within the past 2 weeks regarding orthopedic issue.  Does not sleep well.  He does rotate and move around a lot in his sleep.  No other aggravating or relieving factors.    No other c/o.     09/28/2023    8:14 AM  Depression screen PHQ 2/9  Decreased Interest 3  Down, Depressed, Hopeless 3  PHQ - 2 Score 6  Altered sleeping 3  Tired, decreased energy 3  Change in appetite 3  Feeling bad or failure about yourself  3  Trouble concentrating 3  Moving slowly or fidgety/restless 1  Suicidal thoughts 1  PHQ-9 Score 23  Difficult doing work/chores Somewhat difficult     Past Medical History:  Diagnosis Date   Acne    ADD (attention deficit disorder)    Allergy    RHINITIS   Current Outpatient Medications on File Prior to  Visit  Medication Sig Dispense Refill   gabapentin (NEURONTIN) 300 MG capsule Take by mouth.     levocetirizine (XYZAL) 5 MG tablet      meloxicam (MOBIC) 15 MG tablet Take 15 mg by mouth daily.     No current facility-administered medications on file prior to visit.    The following portions of the patient's history were reviewed and updated as appropriate: allergies, current medications, past family history, past medical history, past social history, past surgical history and problem list.  ROS Otherwise as in subjective above    Objective: BP 110/70   Pulse 86   Ht 6' (1.829 m)   Wt 283 lb 9.6 oz (128.6 kg)   BMI 38.46 kg/m   General appearance: alert, no distress, well developed, well nourished Psych: pleasant, good eye contact, tearful at times   Assessment: Encounter Diagnoses  Name Primary?   Depressed mood Yes   Sleep disturbance    BMI 38.0-38.9,adult    Loud snoring    Non-restorative sleep      Plan: Discussed concerns, symptoms.  Regarding snoring, non restful sleep, referral for home sleep study through [facility].  Discussed possible remedies and treatment for OSA if he is positive  Depressed mood, crying spells Counseled on strategies  to cope Begin trial of Fluoxetine 10mg  daily for the first 15 days, then increase to 20mg  daily. Discussed risks/benefits of medicaiton Consider going back to counseling Counseling resources given F/u 3-4 weeks    Keith Olsen was seen today for consult.  Diagnoses and all orders for this visit:  Depressed mood  Sleep disturbance  BMI 38.0-38.9,adult  Loud snoring  Non-restorative sleep  Other orders -     FLUoxetine (PROZAC) 10 MG capsule; Take 1 capsule (10 mg total) by mouth daily. -     FLUoxetine (PROZAC) 20 MG capsule; Take 1 capsule (20 mg total) by mouth daily.    Follow up: 3-4 weeks

## 2023-10-08 DIAGNOSIS — G4730 Sleep apnea, unspecified: Secondary | ICD-10-CM | POA: Diagnosis not present

## 2023-10-10 ENCOUNTER — Other Ambulatory Visit: Payer: Self-pay | Admitting: Medical

## 2023-10-19 ENCOUNTER — Encounter: Payer: Self-pay | Admitting: Medical

## 2023-10-19 ENCOUNTER — Ambulatory Visit: Payer: BC Managed Care – PPO | Admitting: Medical

## 2023-10-19 VITALS — BP 114/78 | HR 73 | Wt 280.4 lb

## 2023-10-19 DIAGNOSIS — F419 Anxiety disorder, unspecified: Secondary | ICD-10-CM | POA: Diagnosis not present

## 2023-10-19 DIAGNOSIS — G478 Other sleep disorders: Secondary | ICD-10-CM | POA: Insufficient documentation

## 2023-10-19 DIAGNOSIS — R4589 Other symptoms and signs involving emotional state: Secondary | ICD-10-CM | POA: Diagnosis not present

## 2023-10-19 NOTE — Progress Notes (Signed)
Subjective:  Keith Olsen is a 32 y.o. male who presents for Chief Complaint  Patient presents with   Depression    Medicine is numbing. Feeling like zombie. Some nausea in beginning.    Snoring    SNAP test a couple weeks ago. No changes.      Here for some concerns  I saw him 09/28/2023 for depressed mood, anxiety.  He reported a long history of depressed mood and anxious mood.  However last visit we started trial of medication, fluoxetine.  He has never been on medication before for this.  He has seen improvements including less anger, less crying spells, more focused since last visit.  His girlfriend and others have commented that he seems to be doing better.  He however he still feels in a haze or zombielike at times.  He has only been on the 20 mg fluoxetine about a week.  He started out on the 10 mg for 15 days.  No SI/HI or plan  Has done therapy in the past but it was costly, can't afford this.  Last visit we discussed sleep concerns, possible sleep apnea.  He did 3 nights of testing and is awaiting the home sleep study results from [facility]  No other aggravating or relieving factors.    No other c/o.     09/28/2023    8:14 AM  Depression screen PHQ 2/9  Decreased Interest 3  Down, Depressed, Hopeless 3  PHQ - 2 Score 6  Altered sleeping 3  Tired, decreased energy 3  Change in appetite 3  Feeling bad or failure about yourself  3  Trouble concentrating 3  Moving slowly or fidgety/restless 1  Suicidal thoughts 1  PHQ-9 Score 23  Difficult doing work/chores Somewhat difficult     Past Medical History:  Diagnosis Date   Acne    ADD (attention deficit disorder)    Allergy    RHINITIS   Current Outpatient Medications on File Prior to Visit  Medication Sig Dispense Refill   FLUoxetine (PROZAC) 20 MG capsule Take 1 capsule (20 mg total) by mouth daily. 30 capsule 1   gabapentin (NEURONTIN) 300 MG capsule Take by mouth.     FLUoxetine (PROZAC) 10 MG  capsule Take 1 capsule (10 mg total) by mouth daily. (Patient not taking: Reported on 10/19/2023) 15 capsule 0   levocetirizine (XYZAL) 5 MG tablet  (Patient not taking: Reported on 10/19/2023)     meloxicam (MOBIC) 15 MG tablet Take 15 mg by mouth daily. (Patient not taking: Reported on 10/19/2023)     No current facility-administered medications on file prior to visit.    The following portions of the patient's history were reviewed and updated as appropriate: allergies, current medications, past family history, past medical history, past social history, past surgical history and problem list.  ROS Otherwise as in subjective above    Objective: BP 114/78   Pulse 73   Wt 280 lb 6.4 oz (127.2 kg)   SpO2 97%   BMI 38.03 kg/m   Wt Readings from Last 3 Encounters:  10/19/23 280 lb 6.4 oz (127.2 kg)  09/28/23 283 lb 9.6 oz (128.6 kg)  04/05/23 290 lb 12.8 oz (131.9 kg)   General appearance: alert, no distress, well developed, well nourished Psych: pleasant, good eye contact, tearful at times   Assessment: Encounter Diagnoses  Name Primary?   Depressed mood Yes   Anxiety    Non-restorative sleep       Plan:  Discussed concerns, symptoms. Overall there has been improvement on fluoxetine.  He started 15 days on the 10 mg and then went up to 20 mg.  He does note less crying spells, less anger feelings, more focus.  Other people commented on the improvements as well.  Lets give more time of the 20 mg.  I asked him to call back in 2 to 3 weeks once he has been on 20 mg almost a month.  If still feeling somewhat flat we will try Wellbutrin or other SSRI or other option.  Regarding snoring, non restful sleep, still pending sleep study results which should be back anytime.   Keith Olsen was seen today for depression and snoring.  Diagnoses and all orders for this visit:  Depressed mood  Anxiety  Non-restorative sleep    Follow up: pending sleep results

## 2023-10-19 NOTE — Progress Notes (Signed)
I have faxed over order for [facility] for sleep study

## 2023-11-04 ENCOUNTER — Telehealth: Payer: Self-pay | Admitting: Medical

## 2023-11-04 DIAGNOSIS — G4733 Obstructive sleep apnea (adult) (pediatric): Secondary | ICD-10-CM

## 2023-11-04 NOTE — Telephone Encounter (Signed)
Sleep study abnormal showing sleep apnea.  Recommendations: Consider elevating the head of the bed Work to lose weight through healthy diet and exercise I recommend a trial of CPAP device to help ensure flow getting into the lungs and body during periods of apnea.  If agreeable go ahead and send orders for CPAP and put him on the schedule for follow-up in 1 month regarding this

## 2023-11-04 NOTE — Telephone Encounter (Signed)
Pt was notified of results. I have put in order for DME through [facility] for machine and supplies

## 2023-11-04 NOTE — Addendum Note (Signed)
Addended by: Herminio Commons A on: 11/04/2023 11:49 AM   Modules accepted: Orders

## 2023-11-05 ENCOUNTER — Encounter: Payer: Self-pay | Admitting: Internal Medicine

## 2023-11-15 DIAGNOSIS — G4733 Obstructive sleep apnea (adult) (pediatric): Secondary | ICD-10-CM | POA: Diagnosis not present

## 2023-11-28 ENCOUNTER — Other Ambulatory Visit: Payer: Self-pay | Admitting: Medical

## 2023-12-16 DIAGNOSIS — G4733 Obstructive sleep apnea (adult) (pediatric): Secondary | ICD-10-CM | POA: Diagnosis not present

## 2024-01-16 DIAGNOSIS — G4733 Obstructive sleep apnea (adult) (pediatric): Secondary | ICD-10-CM | POA: Diagnosis not present

## 2024-01-20 DIAGNOSIS — G4733 Obstructive sleep apnea (adult) (pediatric): Secondary | ICD-10-CM | POA: Diagnosis not present

## 2024-01-24 ENCOUNTER — Encounter: Payer: Self-pay | Admitting: Internal Medicine

## 2024-02-08 ENCOUNTER — Encounter: Payer: Self-pay | Admitting: Internal Medicine

## 2024-02-13 DIAGNOSIS — G4733 Obstructive sleep apnea (adult) (pediatric): Secondary | ICD-10-CM | POA: Diagnosis not present

## 2024-03-16 ENCOUNTER — Encounter: Payer: Self-pay | Admitting: Internal Medicine

## 2024-03-16 ENCOUNTER — Other Ambulatory Visit: Payer: Self-pay | Admitting: Medical

## 2024-05-11 ENCOUNTER — Other Ambulatory Visit: Payer: Self-pay | Admitting: Medical

## 2024-05-11 NOTE — Telephone Encounter (Signed)
 Patient needs to schedule a visit before I can refill medication. Please schedule

## 2024-05-18 ENCOUNTER — Other Ambulatory Visit (HOSPITAL_BASED_OUTPATIENT_CLINIC_OR_DEPARTMENT_OTHER): Payer: Self-pay

## 2024-05-18 ENCOUNTER — Ambulatory Visit: Admitting: Family Medicine

## 2024-05-18 ENCOUNTER — Encounter: Payer: Self-pay | Admitting: Family Medicine

## 2024-05-18 VITALS — BP 124/72 | HR 88 | Wt 287.0 lb

## 2024-05-18 DIAGNOSIS — R4589 Other symptoms and signs involving emotional state: Secondary | ICD-10-CM

## 2024-05-18 MED ORDER — FLUOXETINE HCL 10 MG PO CAPS
10.0000 mg | ORAL_CAPSULE | Freq: Every day | ORAL | 1 refills | Status: DC
  Filled 2024-05-18: qty 30, 30d supply, fill #0

## 2024-05-18 NOTE — Progress Notes (Signed)
   Subjective:    Patient ID: Keith Olsen, male    DOB: [birth year], 33 y.o.   MRN: [id number]  HPI He is here for a recheck.  He is taking Prozac .  He thinks he is on the 20 mg dosing of that.  He is feeling that it is helping with staying focused but has decreased his emotions and to the point where he almost feels like he is a zombie.  He has not gotten involved in any kind of counseling for any of this.  He does suffer from some low self-esteem thinking that he is a failure.  He is involved in a relationship and trying to be supportive for his girlfriend who also has children and is going through a divorce.   Review of Systems     Objective:    Physical Exam Alert and in no distress otherwise not examined       Assessment & Plan:  Depressed mood - Plan: FLUoxetine  (PROZAC ) 10 MG capsule I will drop him down to 10 mg.  He will check to make sure that this will be a decrease in the dose and if there is any question I will switch to a different antidepressant.  He was also given the phone number for [place].  Briefly discussed counseling with him and the benefits of this to help him think through his present situation and be the most beneficial for him.  He is to set up a virtual appointment with me in 1 month.

## 2024-05-24 ENCOUNTER — Other Ambulatory Visit: Payer: Self-pay | Admitting: Medical

## 2024-05-24 ENCOUNTER — Other Ambulatory Visit: Payer: Self-pay | Admitting: Family Medicine

## 2024-05-24 DIAGNOSIS — R4589 Other symptoms and signs involving emotional state: Secondary | ICD-10-CM

## 2024-05-24 MED ORDER — FLUOXETINE HCL 10 MG PO CAPS: 10.0000 mg | ORAL_CAPSULE | Freq: Every day | ORAL | 1 refills | Status: DC

## 2024-05-24 NOTE — Telephone Encounter (Signed)
 Dr. Robina Chol dropped dose to 10mg  at recent visit

## 2024-05-24 NOTE — Telephone Encounter (Signed)
 Next appt 06/21/2024

## 2024-05-24 NOTE — Telephone Encounter (Signed)
 Copied from CRM [id number]. Topic: Clinical - Prescription Issue >> May 24, 2024  4:29 PM Hamp Levine R wrote: Reason for CRM: Patients refill for FLUoxetine  (PROZAC ) 10 MG capsule was sent to the incorrect pharmacy. Please update and resend.  [id number] Jonette Nestle, [place] - [address] AT [facility] [address] [facility] [address] & [facility] [address] [place] [address] Phone: [phone] Fax: [phone]  Patient can be reached at [phone]

## 2024-05-25 ENCOUNTER — Other Ambulatory Visit (HOSPITAL_BASED_OUTPATIENT_CLINIC_OR_DEPARTMENT_OTHER): Payer: Self-pay

## 2024-06-14 ENCOUNTER — Ambulatory Visit: Admitting: Family Medicine

## 2024-06-14 ENCOUNTER — Ambulatory Visit: Payer: Self-pay | Admitting: *Deleted

## 2024-06-14 ENCOUNTER — Encounter: Payer: Self-pay | Admitting: Family Medicine

## 2024-06-14 VITALS — BP 130/80 | HR 63 | Wt 289.2 lb

## 2024-06-14 DIAGNOSIS — M778 Other enthesopathies, not elsewhere classified: Secondary | ICD-10-CM | POA: Diagnosis not present

## 2024-06-14 DIAGNOSIS — R4589 Other symptoms and signs involving emotional state: Secondary | ICD-10-CM

## 2024-06-14 NOTE — Telephone Encounter (Signed)
 FYI Only or Action Required?: Action required by provider: request for appointment.  Patient was last seen in primary care on 05/18/2024 by Channelle Bottger Norleen BROCKS, MD. Called Nurse Triage reporting Pain. Symptoms began yesterday. Interventions attempted: Prescription medications: prednisone. Symptoms are: gradually worsening.  Triage Disposition: See HCP Within 4 Hours (Or PCP Triage)  Patient/caregiver understands and will follow disposition?: Yes                       Copied from CRM [id number]. Topic: Clinical - Red Word Triage >> Jun 14, 2024  8:17 AM Thersia BROCKS wrote: [facility] that prompted transfer to Nurse Triage: Patient called in stated he is in extreme pain in his left foot    ----------------------------------------------------------------------- From previous Reason for Contact - Scheduling: Patient/patient representative is calling to schedule an appointment. Refer to attachments for appointment information. Reason for Disposition  [1] SEVERE pain (e.g., excruciating, unable to do any normal activities) AND [2] not improved after 2 hours of pain medicine  Answer Assessment - Initial Assessment Questions 1. ONSET: When did the pain start?      Yesterday morning  2. LOCATION: Where is the pain located?      Left foot top area at toes 3. PAIN: How bad is the pain?    (Scale 1-10; or mild, moderate, severe)  - MILD (1-3): doesn't interfere with normal activities.   - MODERATE (4-7): interferes with normal activities (e.g., work or school) or awakens from sleep, limping.   - SEVERE (8-10): excruciating pain, unable to do any normal activities, unable to walk.      0/10 pain at rest , with mobility 10/10 4. WORK OR EXERCISE: Has there been any recent work or exercise that involved this part of the body?      A lot of walking for job 5. CAUSE: What do you think is causing the foot pain?     Not sure happened/ noticed suddenly  6. OTHER SYMPTOMS: Do you have any  other symptoms? (e.g., leg pain, rash, fever, numbness)     Can walk but painful. No swelling no redness no injury 7. PREGNANCY: Is there any chance you are pregnant? When was your last menstrual period?     Na  Appt scheduled today none available with PCP. Patient reports he has started taking prednisone per provider at his work but needs f/u with PCP or his provider.  Protocols used: Foot Pain-A-AH

## 2024-06-14 NOTE — Progress Notes (Signed)
   Subjective:    Patient ID: Keith Olsen, male    DOB: 1990/12/27, 33 y.o.   MRN: [id number]  HPI He states that yesterday he woke up with left foot pain mainly over the dorsal aspect of his foot.  No history of injury or overuse, new shoes, new services to walk on.  He apparently did get steroid pills from nurse practitioner at work but has not started them.  He was told this could be plantar fasciitis. He also has been involved in counseling to help deal with stress and depressed mood.  He is doing this over the phone and the therapist is helping him deal with life situations especially revolving around his girlfriend and their interaction.  He seems to be gaining good insight into this.   Review of Systems     Objective:    Physical Exam Exam of the left foot does show tenderness palpation over the dorsum of the foot especially over the fourth metatarsal tendon.  Extension of that particular tendon was more painful than any other tendon in the area.  No bony tenderness.  Full motion of the ankle.       Assessment & Plan:  Extensor tendinitis of foot  Depressed mood Recommend 2 Aleve  twice and make sure the shoe is fitting properly.  Explained that I did not think that this was of any major concern.  We will do this conservatively but if he has further difficulty, further evaluation and treatment will be needed. Encouraged him to continue in counseling as he seems to be making good progress in learning how to deal more effectively with the present situation.

## 2024-06-21 ENCOUNTER — Ambulatory Visit: Admitting: Family Medicine

## 2024-06-23 ENCOUNTER — Ambulatory Visit: Payer: Self-pay

## 2024-06-23 NOTE — Telephone Encounter (Signed)
 Pt was offered an appointment today by [facility] and patient declined

## 2024-06-23 NOTE — Telephone Encounter (Signed)
 FYI Only or Action Required?: Action required by provider: request for appointment.  Patient was last seen in primary care on 06/14/2024 by Joyce Norleen BROCKS, MD.  Called Nurse Triage reporting Foot Pain.  Symptoms began about a month ago.  Interventions attempted: OTC medications: Aleve .  Symptoms are: gradually worsening.  Triage Disposition: See PCP When Office is Open (Within 3 Days)  Patient/caregiver understands and will follow disposition?: YesCopied from CRM [id number]. Topic: Clinical - Red Word Triage >> Jun 23, 2024 10:26 AM Marissa P wrote: Red Word that prompted transfer to Nurse Triage: Patient called he is having pain in his left foot its gotten worse since being seen, would like to be seen right away. Reason for Disposition  [1] MODERATE pain (e.g., interferes with normal activities, limping) AND [2] present > 3 days  Answer Assessment - Initial Assessment Questions 1. ONSET: When did the pain start?      Less than a month 2. LOCATION: Where is the pain located?      Left foot 3. PAIN: How bad is the pain?    (Scale 1-10; or mild, moderate, severe)     severe  5. CAUSE: What do you think is causing the foot pain?     Not sure 6. OTHER SYMPTOMS: Do you have any other symptoms? (e.g., leg pain, rash, fever, numbness)     Tingle, pressure, tight. Some swelling.    Pt was seen 2 weeks ago and pain is still present. Pt stated pain was better but has returned worse. Pt having hard time weight bearing on foot.  Pt is icing, elevating, stretching, Aleve  PM. Pt had a hard time sleeping. Pt declined office visit today. Pt requested appt for Monday.  Protocols used: Foot Pain-A-AH

## 2024-06-26 ENCOUNTER — Ambulatory Visit: Admitting: Medical

## 2024-06-26 DIAGNOSIS — M2141 Flat foot [pes planus] (acquired), right foot: Secondary | ICD-10-CM | POA: Diagnosis not present

## 2024-06-26 DIAGNOSIS — M76822 Posterior tibial tendinitis, left leg: Secondary | ICD-10-CM | POA: Diagnosis not present

## 2024-06-26 DIAGNOSIS — M2142 Flat foot [pes planus] (acquired), left foot: Secondary | ICD-10-CM | POA: Diagnosis not present

## 2024-06-26 DIAGNOSIS — G5762 Lesion of plantar nerve, left lower limb: Secondary | ICD-10-CM | POA: Diagnosis not present

## 2024-06-26 DIAGNOSIS — M65872 Other synovitis and tenosynovitis, left ankle and foot: Secondary | ICD-10-CM | POA: Diagnosis not present

## 2024-07-13 DIAGNOSIS — G5762 Lesion of plantar nerve, left lower limb: Secondary | ICD-10-CM | POA: Diagnosis not present

## 2024-08-01 DIAGNOSIS — M2142 Flat foot [pes planus] (acquired), left foot: Secondary | ICD-10-CM | POA: Diagnosis not present

## 2024-08-01 DIAGNOSIS — M65872 Other synovitis and tenosynovitis, left ankle and foot: Secondary | ICD-10-CM | POA: Diagnosis not present

## 2024-08-01 DIAGNOSIS — M2141 Flat foot [pes planus] (acquired), right foot: Secondary | ICD-10-CM | POA: Diagnosis not present

## 2024-08-29 ENCOUNTER — Other Ambulatory Visit: Payer: Self-pay | Admitting: Family Medicine

## 2024-08-29 DIAGNOSIS — R4589 Other symptoms and signs involving emotional state: Secondary | ICD-10-CM

## 2024-09-05 ENCOUNTER — Encounter: Payer: Self-pay | Admitting: Family Medicine

## 2024-09-05 ENCOUNTER — Ambulatory Visit: Admitting: Family Medicine

## 2024-09-05 VITALS — BP 128/70 | HR 75 | Ht 73.0 in | Wt 292.4 lb

## 2024-09-05 DIAGNOSIS — M778 Other enthesopathies, not elsewhere classified: Secondary | ICD-10-CM | POA: Insufficient documentation

## 2024-09-05 DIAGNOSIS — R4589 Other symptoms and signs involving emotional state: Secondary | ICD-10-CM | POA: Diagnosis not present

## 2024-09-05 MED ORDER — FLUOXETINE HCL 20 MG PO CAPS: 20.0000 mg | ORAL_CAPSULE | Freq: Every day | ORAL | 1 refills | Status: AC

## 2024-09-05 NOTE — Progress Notes (Signed)
   Subjective:    Patient ID: Keith Olsen, male    DOB: 04/25/1991, 33 y.o.   MRN: [id number]  Discussed the use of AI scribe software for clinical note transcription with the patient, who gave verbal consent to proceed.  History of Present Illness   Keith Olsen is a 33 year old male who presents for follow-up on his foot condition and psychological well-being.  His foot tendinitis, previously treated in July, has improved significantly. He received insurance-covered insoles and a steroid injection from a podiatrist. He reports that his pain has improved. He reports that he can walk and that both feet feel normal now. He primarily uses the insoles in his boots, which he wears most often.  Regarding his psychological health, he is currently taking Prozac  10 mg. He feels good but notes some emotional blunting, such as not crying at a recent funeral despite usually being emotional in such situations. He experiences occasional zoning out but feels that the medication is beneficial overall. He uses the [employer] for counseling, which he finds convenient as it allows him to talk to someone from home. He estimates his psychological state to be about 75% back to normal.  His social history includes working for the [employer], where he has access to a nurse practitioner for minor health needs. He receives free flu shots at work.           Review of Systems     Objective:    Physical Exam       Alert and in no distress with appropriate mood.       Assessment & Plan:  Assessment and Plan    Major depressive disorder, unspecified Improvement with Prozac  10 mg but still experiences emotional blunting. Open to dosage increase to enhance condition, aware of potential for increased blunting. Counseling remains crucial for addressing underlying issues. - Increase Prozac  dosage. - Continue [employer] app for counseling. - Send MyChart message in one month to report progress. -  Continue medication for 6-12 months with counseling.     Extender tendinitis of the foot continue to wear the orthotics.

## 2024-09-27 ENCOUNTER — Other Ambulatory Visit: Payer: Self-pay | Admitting: Family Medicine

## 2024-09-27 DIAGNOSIS — R4589 Other symptoms and signs involving emotional state: Secondary | ICD-10-CM

## 2025-03-06 ENCOUNTER — Ambulatory Visit: Admitting: Family Medicine

## 2025-03-15 ENCOUNTER — Ambulatory Visit: Payer: Self-pay | Admitting: Family Medicine
